# Patient Record
Sex: Female | Born: 1985 | Race: White | Hispanic: No | Marital: Married | Smoking: Never smoker
Health system: Southern US, Community
[De-identification: ages and names within clinical notes are randomized; demographics above are authoritative.]

## PROBLEM LIST (undated history)

## (undated) DIAGNOSIS — F419 Anxiety disorder, unspecified: Secondary | ICD-10-CM

## (undated) DIAGNOSIS — Q513 Bicornate uterus: Secondary | ICD-10-CM

## (undated) DIAGNOSIS — K831 Obstruction of bile duct: Secondary | ICD-10-CM

## (undated) DIAGNOSIS — F32A Depression, unspecified: Secondary | ICD-10-CM

## (undated) DIAGNOSIS — Z9889 Other specified postprocedural states: Secondary | ICD-10-CM

## (undated) DIAGNOSIS — O26619 Liver and biliary tract disorders in pregnancy, unspecified trimester: Secondary | ICD-10-CM

## (undated) DIAGNOSIS — J45909 Unspecified asthma, uncomplicated: Secondary | ICD-10-CM

## (undated) DIAGNOSIS — O021 Missed abortion: Secondary | ICD-10-CM

## (undated) DIAGNOSIS — F329 Major depressive disorder, single episode, unspecified: Secondary | ICD-10-CM

## (undated) DIAGNOSIS — O139 Gestational [pregnancy-induced] hypertension without significant proteinuria, unspecified trimester: Secondary | ICD-10-CM

## (undated) DIAGNOSIS — O26649 Intrahepatic cholestasis of pregnancy, unspecified trimester: Secondary | ICD-10-CM

## (undated) DIAGNOSIS — R112 Nausea with vomiting, unspecified: Secondary | ICD-10-CM

## (undated) HISTORY — DX: Liver and biliary tract disorders in pregnancy, unspecified trimester: O26.619

## (undated) HISTORY — DX: Gestational (pregnancy-induced) hypertension without significant proteinuria, unspecified trimester: O13.9

## (undated) HISTORY — PX: TONSILLECTOMY: SUR1361

## (undated) HISTORY — DX: Depression, unspecified: F32.A

## (undated) HISTORY — DX: Anxiety disorder, unspecified: F41.9

## (undated) HISTORY — PX: WISDOM TOOTH EXTRACTION: SHX21

## (undated) HISTORY — DX: Intrahepatic cholestasis of pregnancy, unspecified trimester: O26.649

## (undated) HISTORY — PX: DILATION AND CURETTAGE OF UTERUS: SHX78

## (undated) HISTORY — DX: Major depressive disorder, single episode, unspecified: F32.9

## (undated) HISTORY — DX: Bicornate uterus: Q51.3

## (undated) HISTORY — DX: Obstruction of bile duct: K83.1

---

## 2008-09-17 ENCOUNTER — Encounter: Admission: RE | Admit: 2008-09-17 | Discharge: 2008-09-17 | Payer: Self-pay | Admitting: Family Medicine

## 2010-02-19 IMAGING — US US RENAL
1 series · 14 of 25 positions shown · non-contrast
Comparison: None

CLINICAL DATA: Uncontrolled blood pressure

RENAL/URINARY TRACT ULTRASOUND COMPLETE

[Series 1: us renal · 0.24mm/px · 14 of 30 slices shown]
[im 1/30]
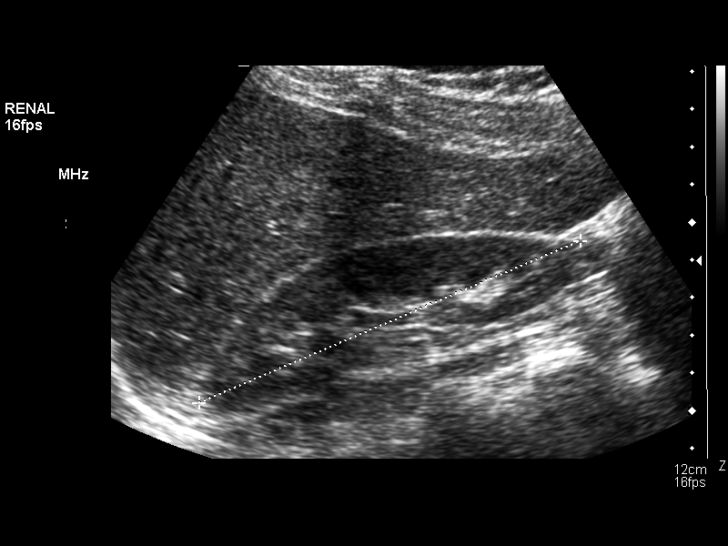
[im 3/30]
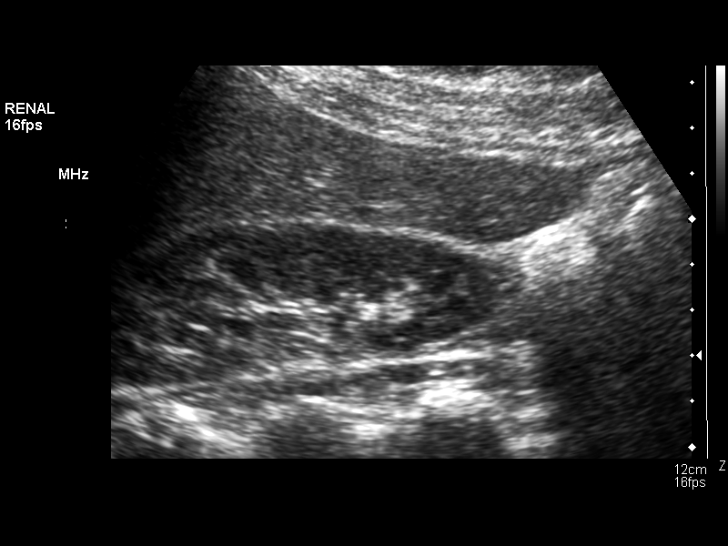
[im 5/30]
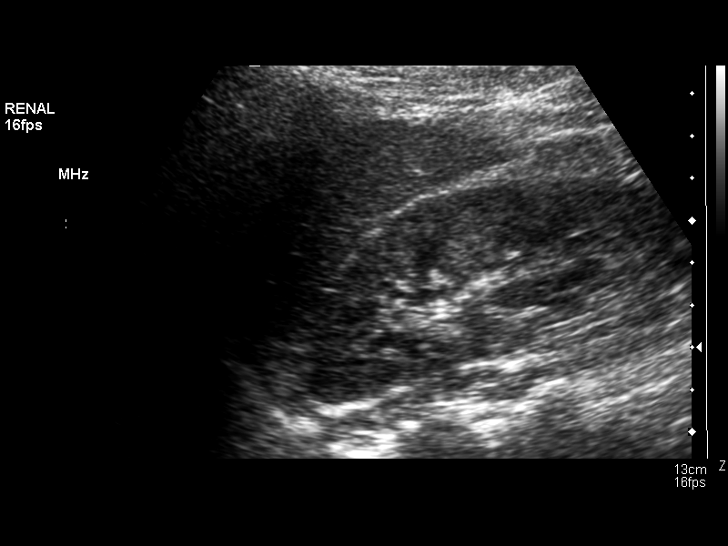
[im 8/30]
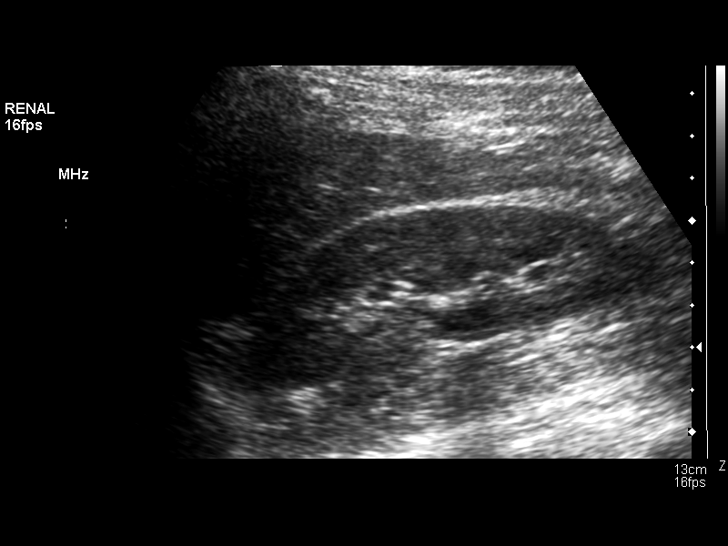
[im 10/30]
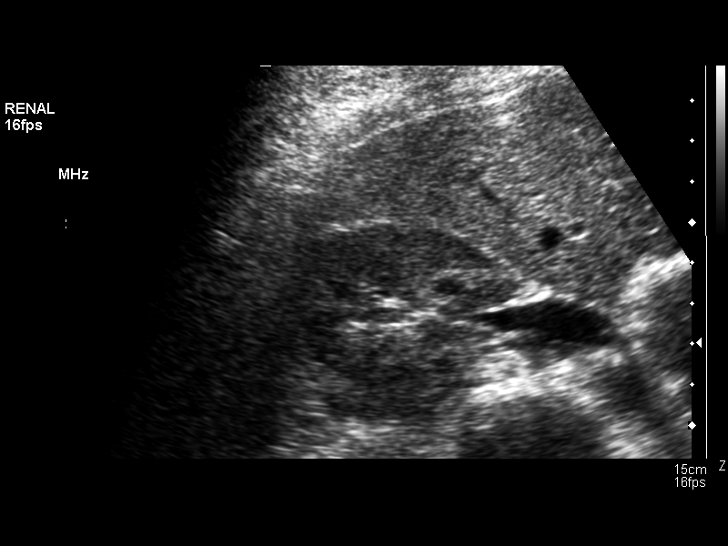
[im 11/30]
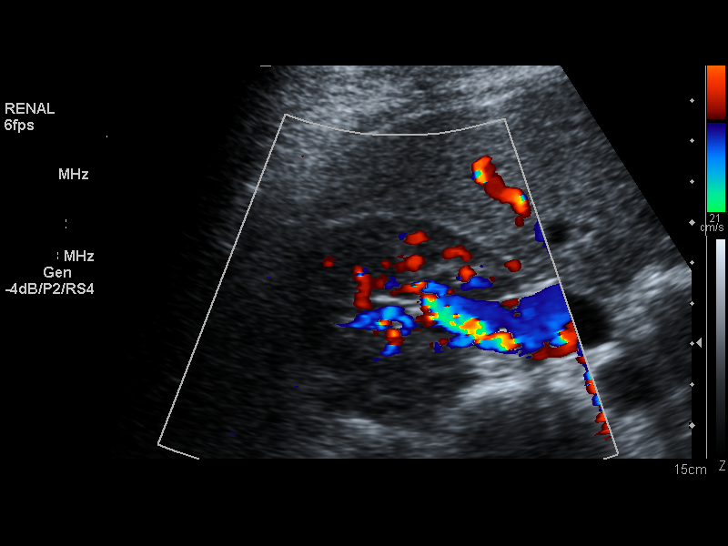
[im 14/30]
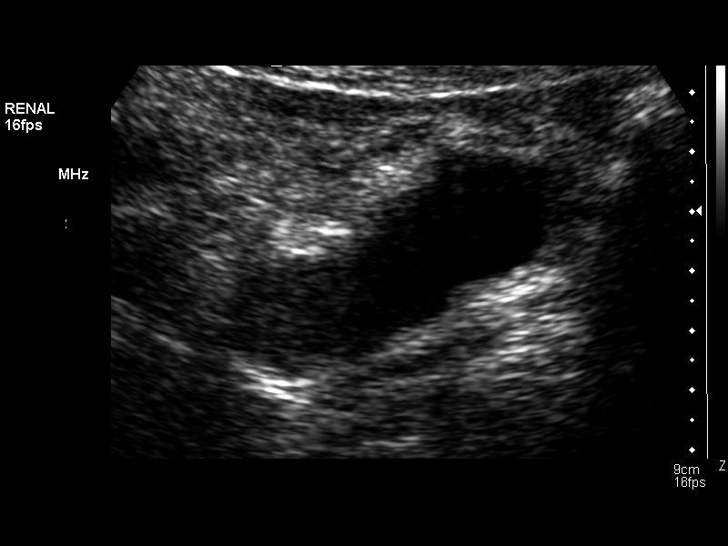
[im 16/30]
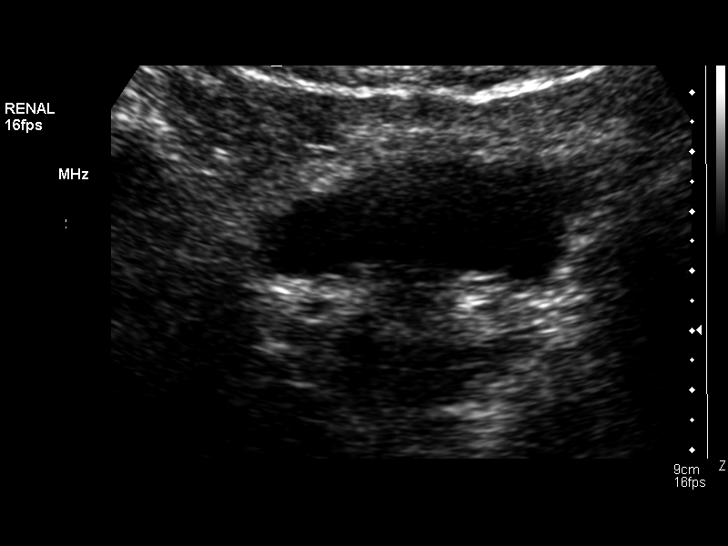
[im 19/30]
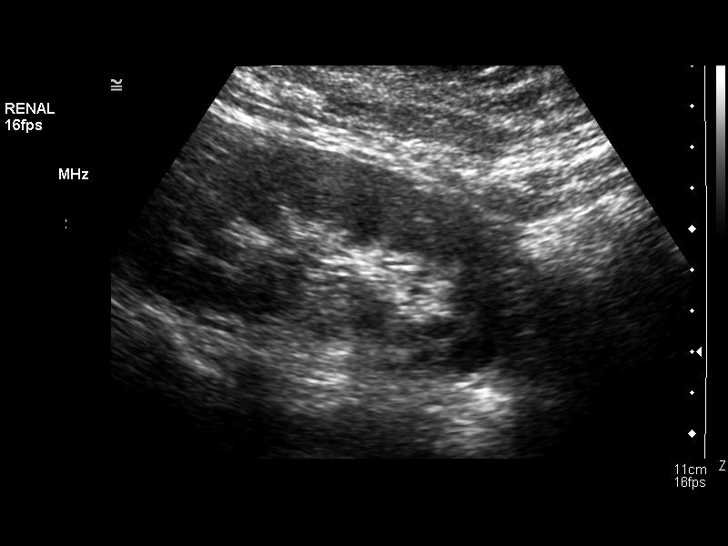
[im 20/30]
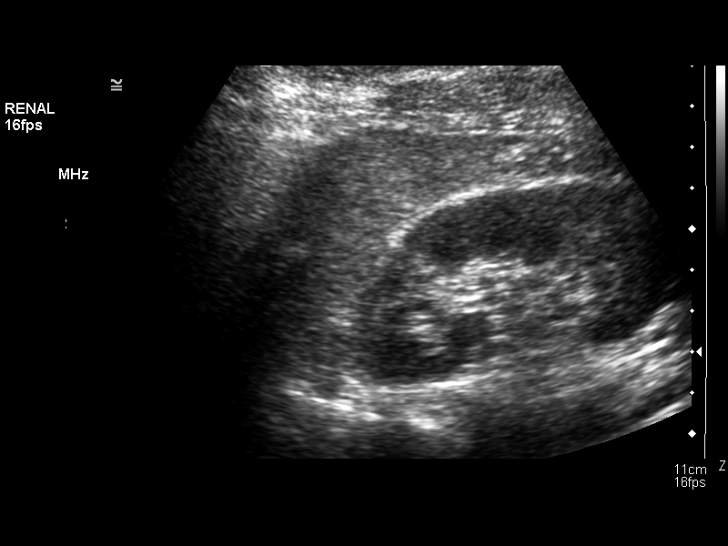
[im 22/30]
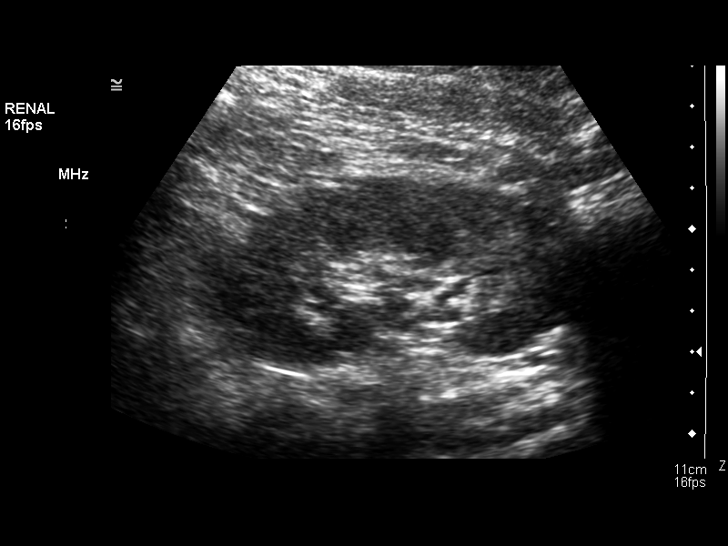
[im 25/30]
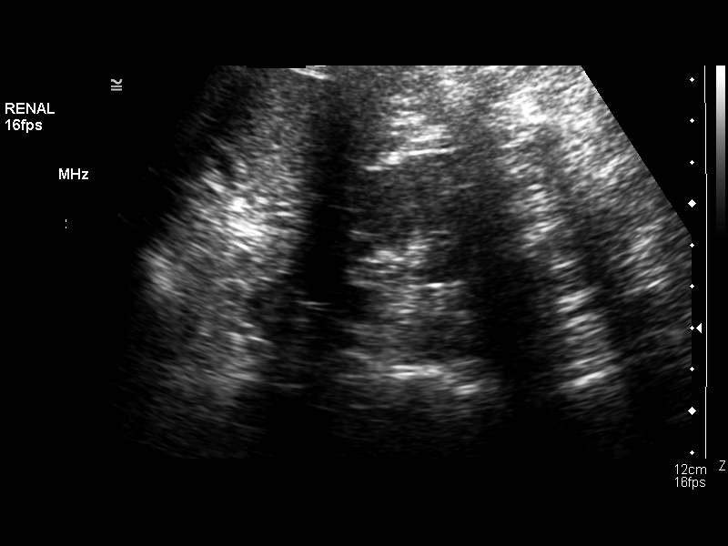
[im 27/30]
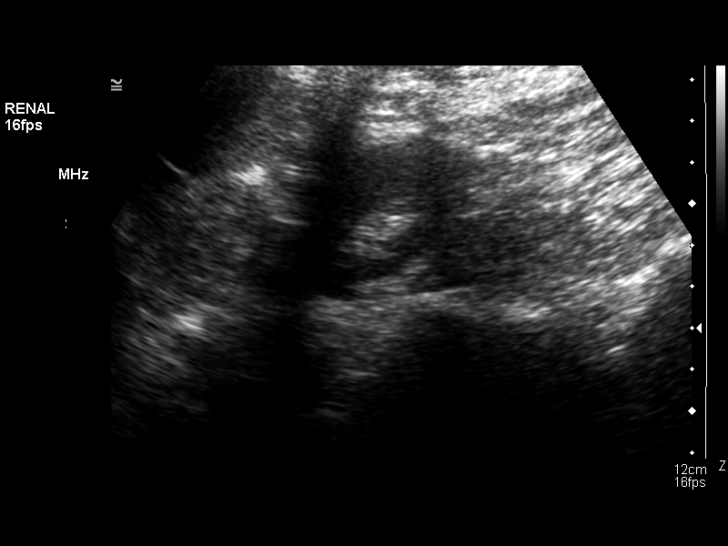
[im 30/30]
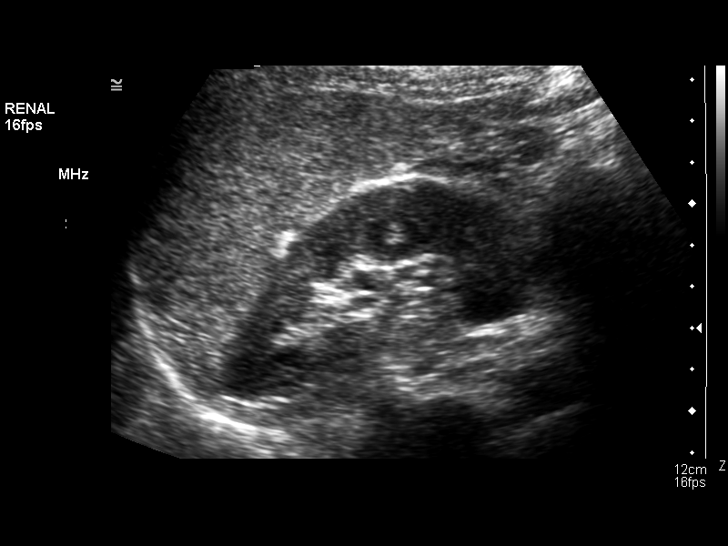

[14 of 25 positions shown; findings below may reference images not displayed]

FINDINGS: Right Kidney:  No hydronephrosis is seen.  The right kidney
measures 11.0 cm sagittally.  Renal echogenicity is within normal
limits.

Left Kidney:  No hydronephrosis and the left kidney measures
cm sagittally.

Bladder:  The urinary bladder is decompressed and cannot be well
assessed.
IMPRESSION: No hydronephrosis.  Renal parenchymal echogenicity is within normal
limits.

## 2014-04-03 ENCOUNTER — Encounter (HOSPITAL_COMMUNITY): Payer: Self-pay | Admitting: Obstetrics and Gynecology

## 2014-04-03 DIAGNOSIS — O021 Missed abortion: Secondary | ICD-10-CM

## 2014-04-03 HISTORY — DX: Missed abortion: O02.1

## 2014-04-03 NOTE — H&P (Signed)
Lorraine Garrett is an 29 y.o. female G1P0 at 7112 wk by dates, 9 wk by recent US, no FHTs.d/w pt and husband miscarriage.  D/w pt management of missed AB.  Pt desires D&C, voiced understanding to r/b/a.    Pertinent Gynecological History: Menses: regular every month without intermenstrual spotting Bleeding: normal Contraception: none, desired pregnancy Blood transfusions: none Sexually transmitted diseases: no past history Previous GYN Procedures: N/A  Last mammogram: N/A  Last pap: normal  OB History: G1P0010  Question bicorate uterus Dyspareunia helped by trigger point injection  Menstrual History: No LMP recorded.    Past Medical History  Diagnosis Date  . 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 Aspirin Other (See Comments)    Asthma attack  Advil OK    Review of Systems  Constitutional: Negative.   HENT: Negative.   Eyes: Negative.   Respiratory: Negative.   Cardiovascular: Negative.   Gastrointestinal: Negative.   Genitourinary: Negative.   Musculoskeletal: Negative.   Skin: Negative.   Neurological: Negative.   Psychiatric/Behavioral: Negative.     There were no vitals taken for this visit. Physical Exam  Constitutional: She is oriented to person, place, and time. She appears well-developed and well-nourished.  HENT:  Head: Normocephalic and atraumatic.  Cardiovascular: Normal rate and regular rhythm.   Respiratory: Effort normal and breath sounds normal. No respiratory distress. She has no wheezes.  GI: Soft. Bowel sounds are normal. She exhibits no distension. There is no tenderness.  Musculoskeletal: Normal range of motion.  Neurological: She is alert and oriented to person, place, and time.  Skin: Skin is warm and dry.   Psychiatric: She has a normal mood and affect. Her behavior is normal.   A+/Ab SCr neg/Hgb 12.7/Plt 324K/RPRNR/Ur Cx neg/HepBsAg neg/HIV neg/ GC neg/ Chl neg/ RI/CF neg Dated by US cw LMP  Assessment/Plan: G1P0 with MAB for D&C, d/w pt r/b/a of surgery, wishes to proceed  Bovard-Stuckert, Taequan Stockhausen 04/03/2014, 10:24 PM

## 2014-04-04 ENCOUNTER — Encounter (HOSPITAL_COMMUNITY)
Admission: RE | Disposition: A | Discharge status: Home / Self Care | Payer: Self-pay | Source: Ambulatory Visit | Attending: Obstetrics and Gynecology

## 2014-04-04 ENCOUNTER — Encounter (HOSPITAL_COMMUNITY): Payer: Self-pay

## 2014-04-04 ENCOUNTER — Ambulatory Visit (HOSPITAL_COMMUNITY)
Admission: RE | Admit: 2014-04-04 | Discharge: 2014-04-04 | Disposition: A | Discharge status: Home / Self Care | Payer: BC Managed Care – PPO | Source: Ambulatory Visit | Attending: Obstetrics and Gynecology | Admitting: Obstetrics and Gynecology

## 2014-04-04 ENCOUNTER — Ambulatory Visit (HOSPITAL_COMMUNITY): Payer: BC Managed Care – PPO | Admitting: Anesthesiology

## 2014-04-04 DIAGNOSIS — F419 Anxiety disorder, unspecified: Secondary | ICD-10-CM | POA: Insufficient documentation

## 2014-04-04 DIAGNOSIS — Z888 Allergy status to other drugs, medicaments and biological substances status: Secondary | ICD-10-CM | POA: Insufficient documentation

## 2014-04-04 DIAGNOSIS — Z9889 Other specified postprocedural states: Secondary | ICD-10-CM

## 2014-04-04 DIAGNOSIS — O021 Missed abortion: Secondary | ICD-10-CM | POA: Diagnosis present

## 2014-04-04 DIAGNOSIS — I10 Essential (primary) hypertension: Secondary | ICD-10-CM | POA: Diagnosis not present

## 2014-04-04 DIAGNOSIS — J45909 Unspecified asthma, uncomplicated: Secondary | ICD-10-CM | POA: Diagnosis not present

## 2014-04-04 HISTORY — DX: Nausea with vomiting, unspecified: R11.2

## 2014-04-04 HISTORY — DX: Missed abortion: O02.1

## 2014-04-04 HISTORY — PX: DILATION AND EVACUATION: SHX1459

## 2014-04-04 HISTORY — DX: Other specified postprocedural states: Z98.890

## 2014-04-04 LAB — CBC
HCT: 38.7 % (ref 36.0–46.0)
Hemoglobin: 13.6 g/dL (ref 12.0–15.0)
MCH: 29.6 pg (ref 26.0–34.0)
MCHC: 35.1 g/dL (ref 30.0–36.0)
MCV: 84.3 fL (ref 78.0–100.0)
PLATELETS: 273 10*3/uL (ref 150–400)
RBC: 4.59 MIL/uL (ref 3.87–5.11)
RDW: 12.8 % (ref 11.5–15.5)
WBC: 6.4 10*3/uL (ref 4.0–10.5)

## 2014-04-04 LAB — ABO/RH: ABO/RH(D): A POS

## 2014-04-04 SURGERY — DILATION AND EVACUATION, UTERUS
Anesthesia: Monitor Anesthesia Care | Site: Vagina

## 2014-04-04 MED ORDER — DOXYCYCLINE HYCLATE 100 MG PO TABS
ORAL_TABLET | ORAL | Status: AC
  Filled 2014-04-04: qty 1

## 2014-04-04 MED ORDER — LIDOCAINE HCL (CARDIAC) 20 MG/ML IV SOLN
INTRAVENOUS | Status: AC
  Filled 2014-04-04: qty 5

## 2014-04-04 MED ORDER — ONDANSETRON HCL 4 MG/2ML IJ SOLN
INTRAMUSCULAR | Status: DC | PRN
  Administered 2014-04-04: 4 mg via INTRAVENOUS

## 2014-04-04 MED ORDER — ONDANSETRON HCL 4 MG/2ML IJ SOLN
INTRAMUSCULAR | Status: AC
  Filled 2014-04-04: qty 2

## 2014-04-04 MED ORDER — PRENATAL MULTIVITAMIN CH: 1.0000 | ORAL_TABLET | Freq: Every day | ORAL | Status: DC

## 2014-04-04 MED ORDER — SCOPOLAMINE 1 MG/3DAYS TD PT72
MEDICATED_PATCH | TRANSDERMAL | Status: AC
  Filled 2014-04-04: qty 1

## 2014-04-04 MED ORDER — LIDOCAINE HCL 2 % IJ SOLN
INTRAMUSCULAR | Status: AC
  Filled 2014-04-04: qty 20

## 2014-04-04 MED ORDER — KETOROLAC TROMETHAMINE 30 MG/ML IJ SOLN
INTRAMUSCULAR | Status: AC
  Filled 2014-04-04: qty 1

## 2014-04-04 MED ORDER — MIDAZOLAM HCL 5 MG/5ML IJ SOLN
INTRAMUSCULAR | Status: DC | PRN
  Administered 2014-04-04: 2 mg via INTRAVENOUS

## 2014-04-04 MED ORDER — DOXYCYCLINE HYCLATE 100 MG PO TABS: 200.0000 mg | ORAL_TABLET | Freq: Once | ORAL | Status: DC

## 2014-04-04 MED ORDER — SCOPOLAMINE 1 MG/3DAYS TD PT72
1.0000 | MEDICATED_PATCH | Freq: Once | TRANSDERMAL | Status: DC
  Administered 2014-04-04: 1.5 mg via TRANSDERMAL

## 2014-04-04 MED ORDER — LIDOCAINE HCL (CARDIAC) 20 MG/ML IV SOLN
INTRAVENOUS | Status: DC | PRN
Start: 2014-04-04 — End: 2014-04-04
  Administered 2014-04-04: 40 mg via INTRAVENOUS

## 2014-04-04 MED ORDER — LACTATED RINGERS IV SOLN: INTRAVENOUS | Status: DC

## 2014-04-04 MED ORDER — FENTANYL CITRATE 0.05 MG/ML IJ SOLN
INTRAMUSCULAR | Status: AC
  Filled 2014-04-04: qty 2

## 2014-04-04 MED ORDER — LIDOCAINE HCL 2 % IJ SOLN
INTRAMUSCULAR | Status: DC | PRN
  Administered 2014-04-04: 15 mL

## 2014-04-04 MED ORDER — DOXYCYCLINE HYCLATE 100 MG PO TABS
100.0000 mg | ORAL_TABLET | Freq: Once | ORAL | Status: AC
  Administered 2014-04-04: 100 mg via ORAL

## 2014-04-04 MED ORDER — PROPOFOL 10 MG/ML IV BOLUS
INTRAVENOUS | Status: DC | PRN
  Administered 2014-04-04: 20 mg via INTRAVENOUS
  Administered 2014-04-04 (×2): 30 mg via INTRAVENOUS
  Administered 2014-04-04 (×3): 20 mg via INTRAVENOUS
  Administered 2014-04-04 (×2): 30 mg via INTRAVENOUS

## 2014-04-04 MED ORDER — IBUPROFEN 800 MG PO TABS: 800.0000 mg | ORAL_TABLET | Freq: Three times a day (TID) | ORAL | Status: DC | PRN

## 2014-04-04 MED ORDER — PROPOFOL 10 MG/ML IV BOLUS
INTRAVENOUS | Status: AC
  Filled 2014-04-04: qty 20

## 2014-04-04 MED ORDER — DEXAMETHASONE SODIUM PHOSPHATE 4 MG/ML IJ SOLN
INTRAMUSCULAR | Status: AC
  Filled 2014-04-04: qty 1

## 2014-04-04 MED ORDER — KETOROLAC TROMETHAMINE 30 MG/ML IJ SOLN
INTRAMUSCULAR | Status: DC | PRN
  Administered 2014-04-04: 30 mg via INTRAVENOUS

## 2014-04-04 MED ORDER — DEXAMETHASONE SODIUM PHOSPHATE 4 MG/ML IJ SOLN
INTRAMUSCULAR | Status: DC | PRN
  Administered 2014-04-04: 4 mg via INTRAVENOUS

## 2014-04-04 MED ORDER — MIDAZOLAM HCL 2 MG/2ML IJ SOLN
INTRAMUSCULAR | Status: AC
  Filled 2014-04-04: qty 2

## 2014-04-04 MED ORDER — OXYCODONE-ACETAMINOPHEN 5-325 MG PO TABS: 1.0000 | ORAL_TABLET | Freq: Four times a day (QID) | ORAL | Status: DC | PRN

## 2014-04-04 MED ORDER — LACTATED RINGERS IV SOLN
INTRAVENOUS | Status: DC
  Administered 2014-04-04: 10:00:00 via INTRAVENOUS

## 2014-04-04 MED ORDER — BUPIVACAINE IN DEXTROSE 0.75-8.25 % IT SOLN
INTRATHECAL | Status: DC | PRN
  Administered 2014-04-04: 1.2 mL via INTRATHECAL

## 2014-04-04 MED ORDER — FENTANYL CITRATE 0.05 MG/ML IJ SOLN
INTRAMUSCULAR | Status: DC | PRN
  Administered 2014-04-04: 100 ug via INTRAVENOUS

## 2014-04-04 SURGICAL SUPPLY — 17 items
CATH ROBINSON RED A/P 16FR (CATHETERS) ×3 IMPLANT
CLOTH BEACON ORANGE TIMEOUT ST (SAFETY) ×3 IMPLANT
DECANTER SPIKE VIAL GLASS SM (MISCELLANEOUS) ×3 IMPLANT
GLOVE BIO SURGEON STRL SZ 6.5 (GLOVE) ×2 IMPLANT
GLOVE BIO SURGEONS STRL SZ 6.5 (GLOVE) ×1
GOWN STRL REUS W/TWL LRG LVL3 (GOWN DISPOSABLE) ×6 IMPLANT
KIT BERKELEY 1ST TRIMESTER 3/8 (MISCELLANEOUS) ×3 IMPLANT
NS IRRIG 1000ML POUR BTL (IV SOLUTION) ×3 IMPLANT
PACK VAGINAL MINOR WOMEN LF (CUSTOM PROCEDURE TRAY) ×3 IMPLANT
PAD OB MATERNITY 4.3X12.25 (PERSONAL CARE ITEMS) ×3 IMPLANT
PAD PREP 24X48 CUFFED NSTRL (MISCELLANEOUS) ×3 IMPLANT
SET BERKELEY SUCTION TUBING (SUCTIONS) ×3 IMPLANT
TOWEL OR 17X24 6PK STRL BLUE (TOWEL DISPOSABLE) ×6 IMPLANT
VACURETTE 10 RIGID CVD (CANNULA) ×3 IMPLANT
VACURETTE 7MM CVD STRL WRAP (CANNULA) IMPLANT
VACURETTE 8 RIGID CVD (CANNULA) IMPLANT
VACURETTE 9 RIGID CVD (CANNULA) IMPLANT

## 2014-04-04 NOTE — Interval H&P Note (Signed)
History and Physical Interval Note:  04/04/2014 10:14 AM  Lorraine Garrett  has presented today for surgery, with the diagnosis of Missed AB  The various methods of treatment have been discussed with the patient and family. After consideration of risks, benefits and other options for treatment, the patient has consented to  Procedure(s): DILATATION AND EVACUATION (N/A) as a surgical intervention .  The patient's history has been reviewed, patient examined, no change in status, stable for surgery.  I have reviewed the patient's chart and labs.  Questions were answered to the patient's satisfaction.     Bovard-Stuckert, Anniebelle Devore

## 2014-04-04 NOTE — Anesthesia Postprocedure Evaluation (Signed)
  Anesthesia Post-op Note  Patient: Lorraine Garrett  Procedure(s) Performed: Procedure(s): DILATATION AND EVACUATION (N/A)  Patient Location: PACU  Anesthesia Type:MAC  Level of Consciousness: awake, alert  and oriented  Airway and Oxygen Therapy: Patient Spontanous Breathing  Post-op Pain: mild  Post-op Assessment: Post-op Vital signs reviewed, Patient's Cardiovascular Status Stable, Respiratory Function Stable, Patent Airway, No signs of Nausea or vomiting and Pain level controlled  Post-op Vital Signs: Reviewed and stable  Last Vitals:  Filed Vitals:   04/04/14 1115  BP: 124/66  Pulse: 77  Temp:   Resp: 14    Complications: No apparent anesthesia complications

## 2014-04-04 NOTE — Brief Op Note (Signed)
04/04/2014  10:50 AM  PATIENT:  Kaylyn Limaryn Lightcap  29 y.o. female  PRE-OPERATIVE DIAGNOSIS:  Missed AB  POST-OPERATIVE DIAGNOSIS:  Missed abortion  PROCEDURE:  Procedure(s): DILATATION AND EVACUATION (N/A)  SURGEON:  Surgeon(s) and Role:    * Aliyana Dlugosz Bovard-Stuckert, MD - Primary  ANESTHESIA:   local and IV sedation/MAC  EBL:  Total I/O In: 500 [I.V.:500] Out: 250 [Urine:150; Blood:100]  BLOOD ADMINISTERED:none  DRAINS: none   LOCAL MEDICATIONS USED:  LIDOCAINE  and Amount: 15 ml  SPECIMEN:  Source of Specimen:  POC  DISPOSITION OF SPECIMEN:  PATHOLOGY  COUNTS:  YES  TOURNIQUET:  * No tourniquets in log *  DICTATION: .Other Dictation: Dictation Number [id number]  PLAN OF CARE: Discharge to home after PACU  PATIENT DISPOSITION:  PACU - hemodynamically stable.   Delay start of Pharmacological VTE agent (>24hrs) due to surgical blood loss or risk of bleeding: not applicable

## 2014-04-04 NOTE — Anesthesia Procedure Notes (Signed)
Spinal Patient location during procedure: OR Start time: 04/04/2014 10:32 AM Staffing Anesthesiologist: Briunna Leicht A. Performed by: anesthesiologist  Preanesthetic Checklist Completed: patient identified, site marked, surgical consent, pre-op evaluation, timeout performed, IV checked, risks and benefits discussed and monitors and equipment checked Spinal Block Patient position: sitting Prep: DuraPrep Patient monitoring: heart rate, cardiac monitor, continuous pulse ox and blood pressure Approach: midline Location: L4-5 Injection technique: single-shot Needle Needle type: Sprotte  Needle gauge: 24 G Needle length: 9 cm Needle insertion depth: 5 cm Assessment Sensory level: T6 Additional Notes Patient tolerated procedure well. Adequate sensory level.

## 2014-04-04 NOTE — Discharge Instructions (Signed)
DISCHARGE INSTRUCTIONS:  D&E The following instructions have been prepared to help you care for yourself upon your return home.  MAY TAKE IBUPROFEN (MOTRIN, ADVIL) OR ALEVE AFTER 4:30 PM FOR CRAMPS! MAY TAKE OFF THE PATCH BEHIND YOUR EAR IN 48 HOURS!!    Personal hygiene:  Use sanitary pads for vaginal drainage, not tampons.  Shower the day after your procedure.  NO tub baths, pools or Jacuzzis for 2-3 weeks.  Wipe front to back after using the bathroom.  Activity and limitations:  Do NOT drive or operate any equipment for 24 hours. The effects of anesthesia are still present and drowsiness may result.  Do NOT rest in bed all day.  Walking is encouraged.  Walk up and down stairs slowly.  You may resume your normal activity in one to two days or as indicated by your physician.  Sexual activity: NO intercourse for at least 2 weeks after the procedure, or as indicated by your physician.  Diet: Eat a light meal as desired this evening. You may resume your usual diet tomorrow.  Return to work: You may resume your work activities in one to two days or as indicated by your doctor.  What to expect after your surgery: Expect to have vaginal bleeding/discharge for 2-3 days and spotting for up to 10 days. It is not unusual to have soreness for up to 1-2 weeks. You may have a slight burning sensation when you urinate for the first day. Mild cramps may continue for a couple of days. You may have a regular period in 2-6 weeks.  Call your doctor for any of the following:  Excessive vaginal bleeding, saturating and changing one pad every hour.  Inability to urinate 6 hours after discharge from hospital.  Pain not relieved by pain medication.  Fever of 100.4 F or greater.  Unusual vaginal discharge or odor.   Call for an appointment:    Patients signature: ______________________  Nurses signature ________________________  Support person's  signature_______________________

## 2014-04-04 NOTE — Anesthesia Preprocedure Evaluation (Signed)
Anesthesia Evaluation  Patient identified by MRN, date of birth, ID band Patient awake    Reviewed: Allergy & Precautions, NPO status , Patient's Chart, lab work & pertinent test results  History of Anesthesia Complications (+) PONV and history of anesthetic complications  Airway Mallampati: II       Dental no notable dental hx. (+) Teeth Intact   Pulmonary asthma ,  breath sounds clear to auscultation  Pulmonary exam normal       Cardiovascular negative cardio ROS  Rhythm:Regular Rate:Normal     Neuro/Psych negative neurological ROS  negative psych ROS   GI/Hepatic negative GI ROS, Neg liver ROS,   Endo/Other  negative endocrine ROS  Renal/GU negative Renal ROS  negative genitourinary   Musculoskeletal negative musculoskeletal ROS (+)   Abdominal   Peds  Hematology negative hematology ROS (+)   Anesthesia Other Findings   Reproductive/Obstetrics (+) Pregnancy Missed Ab                             Anesthesia Physical Anesthesia Plan  ASA: II  Anesthesia Plan:    Post-op Pain Management:    Induction:   Airway Management Planned: Natural Airway  Additional Equipment:   Intra-op Plan:   Post-operative Plan:   Informed Consent: I have reviewed the patients History and Physical, chart, labs and discussed the procedure including the risks, benefits and alternatives for the proposed anesthesia with the patient or authorized representative who has indicated his/her understanding and acceptance.     Plan Discussed with: Anesthesiologist, CRNA and Surgeon  Anesthesia Plan Comments:         Anesthesia Quick Evaluation

## 2014-04-04 NOTE — Transfer of Care (Signed)
Immediate Anesthesia Transfer of Care Note  Patient: Lorraine Garrett  Procedure(s) Performed: Procedure(s): DILATATION AND EVACUATION (N/A)  Patient Location: PACU  Anesthesia Type:MAC  Level of Consciousness: awake, alert  and oriented  Airway & Oxygen Therapy: Patient Spontanous Breathing and Patient connected to nasal cannula oxygen  Post-op Assessment: Report given to RN  Post vital signs: Reviewed  Last Vitals:  Filed Vitals:   04/04/14 0942  BP: 131/92  Pulse: 92  Temp: 36.7 C  Resp: 16    Complications: No apparent anesthesia complications

## 2014-04-05 NOTE — Op Note (Signed)
NAMBurnetta Sabin:  Ruest, Taraann               ACCOUNT NO.:  [id number]  MEDICAL RECORD NO.:  [id number]  LOCATION:  [facility]                          FACILITY:  [facility]  PHYSICIAN:  Sherron MondayJody Bovard, MD        DATE OF BIRTH:  December 12, 1985  DATE OF PROCEDURE:  04/04/2014 DATE OF DISCHARGE:  04/04/2014                              OPERATIVE REPORT   PREOPERATIVE DIAGNOSIS:  Missed abortion.  POSTOPERATIVE DIAGNOSIS:  Missed abortion.  PROCEDURE:  Suction D and E.  SURGEON:  Sherron MondayJody Bovard, MD  ANESTHESIA:  IV sedation MAC with local paracervical block.  IV FLUIDS:  500 mL.  URINE OUTPUT:  150 mL.  EBL:  100 mL.  COMPLICATIONS:  None.  PATHOLOGY:  Products of conception.  DESCRIPTION OF PROCEDURE:  After informed consent was reviewed with both the patient and her husband, she was transported to the OR, placed on the table in supine position.  Once anesthesia had been induced and found to be adequate, she was then placed in the [place] stirrups. Prepped and draped in the normal sterile fashion.  Her bladder was sterilely drained.  Using an open-sided speculum, her cervix was easily visualized.  The paracervical block was placed with 15 mL of 2% lidocaine.  A single-tooth tenaculum was used to grasp the anterior lip of her cervix.  The cervix was dilated to accommodate a 9-curette to 1827- [place] without difficulty.  Several passes were made of suctioning curette yielding what was thought to be an appropriate amount of tissue. The tenaculum was removed from the cervix and the pressure was held to make this hemostatic.  The speculum was then removed from the vagina.  Sponge, lap, and needle counts were correct x2 per the operating team.  The patient was turned to the supine position.  She tolerated the procedure well.     Sherron MondayJody Bovard, MD     JB/MEDQ  D:  04/04/2014  T:  04/05/2014  Job:  [id number]

## 2014-04-07 ENCOUNTER — Encounter (HOSPITAL_COMMUNITY): Payer: Self-pay | Admitting: Obstetrics and Gynecology

## 2014-04-08 ENCOUNTER — Encounter (HOSPITAL_COMMUNITY): Payer: Self-pay | Admitting: General Practice

## 2014-04-08 ENCOUNTER — Inpatient Hospital Stay (HOSPITAL_COMMUNITY): Payer: BC Managed Care – PPO

## 2014-04-08 ENCOUNTER — Inpatient Hospital Stay (HOSPITAL_COMMUNITY)
Admission: AD | Admit: 2014-04-08 | Discharge: 2014-04-08 | Disposition: A | Discharge status: Home / Self Care | Payer: BC Managed Care – PPO | Source: Ambulatory Visit | Attending: Obstetrics and Gynecology | Admitting: Obstetrics and Gynecology

## 2014-04-08 DIAGNOSIS — R109 Unspecified abdominal pain: Secondary | ICD-10-CM | POA: Diagnosis not present

## 2014-04-08 DIAGNOSIS — O081 Delayed or excessive hemorrhage following ectopic and molar pregnancy: Secondary | ICD-10-CM | POA: Diagnosis not present

## 2014-04-08 DIAGNOSIS — O021 Missed abortion: Secondary | ICD-10-CM | POA: Insufficient documentation

## 2014-04-08 DIAGNOSIS — N939 Abnormal uterine and vaginal bleeding, unspecified: Secondary | ICD-10-CM | POA: Diagnosis present

## 2014-04-08 DIAGNOSIS — Z9889 Other specified postprocedural states: Secondary | ICD-10-CM

## 2014-04-08 HISTORY — DX: Unspecified asthma, uncomplicated: J45.909

## 2014-04-08 LAB — CBC WITH DIFFERENTIAL/PLATELET
BASOS ABS: 0 10*3/uL (ref 0.0–0.1)
Basophils Relative: 1 % (ref 0–1)
EOS PCT: 7 % — AB (ref 0–5)
Eosinophils Absolute: 0.4 10*3/uL (ref 0.0–0.7)
HCT: 32.3 % — ABNORMAL LOW (ref 36.0–46.0)
Hemoglobin: 11.3 g/dL — ABNORMAL LOW (ref 12.0–15.0)
Lymphocytes Relative: 42 % (ref 12–46)
Lymphs Abs: 2.6 10*3/uL (ref 0.7–4.0)
MCH: 29.9 pg (ref 26.0–34.0)
MCHC: 35 g/dL (ref 30.0–36.0)
MCV: 85.4 fL (ref 78.0–100.0)
Monocytes Absolute: 0.8 10*3/uL (ref 0.1–1.0)
Monocytes Relative: 13 % — ABNORMAL HIGH (ref 3–12)
NEUTROS ABS: 2.4 10*3/uL (ref 1.7–7.7)
Neutrophils Relative %: 39 % — ABNORMAL LOW (ref 43–77)
PLATELETS: 230 10*3/uL (ref 150–400)
RBC: 3.78 MIL/uL — AB (ref 3.87–5.11)
RDW: 12.7 % (ref 11.5–15.5)
WBC: 6.2 10*3/uL (ref 4.0–10.5)

## 2014-04-08 NOTE — Discharge Instructions (Signed)
Dilation and Curettage or Vacuum Curettage, Care After  Refer to this sheet in the next few weeks. These instructions provide you with information on caring for yourself after your procedure. Your health care provider may also give you more specific instructions. Your treatment has been planned according to current medical practices, but problems sometimes occur. Call your health care provider if you have any problems or questions after your procedure.  WHAT TO EXPECT AFTER THE PROCEDURE  After your procedure, it is typical to have light cramping and bleeding. This may last for 2 days to 2 weeks after the procedure.  HOME CARE INSTRUCTIONS   · Do not drive for 24 hours.  · Wait 1 week before returning to strenuous activities.  · Take your temperature 2 times a day for 4 days and write it down. Provide these temperatures to your health care provider if you develop a fever.  · Avoid long periods of standing.  · Avoid heavy lifting, pushing, or pulling. Do not lift anything heavier than 10 pounds (4.5 kg).  · Limit stair climbing to once or twice a day.  · Take rest periods often.  · You may resume your usual diet.  · Drink enough fluids to keep your urine clear or pale yellow.  · Your usual bowel function should return. If you have constipation, you may:  ¨ Take a mild laxative with permission from your health care provider.  ¨ Add fruit and bran to your diet.  ¨ Drink more fluids.  · Take showers instead of baths until your health care provider gives you permission to take baths.  · Do not go swimming or use a hot tub until your health care provider approves.  · Try to have someone with you or available to you the first 24-48 hours, especially if you were given a general anesthetic.  · Do not douche, use tampons, or have sex (intercourse) for 2 weeks after the procedure.  · Only take over-the-counter or prescription medicines as directed by your health care provider. Do not take aspirin. It can cause  bleeding.  · Follow up with your health care provider as directed.  SEEK MEDICAL CARE IF:   · You have increasing cramps or pain that is not relieved with medicine.  · You have abdominal pain that does not seem to be related to the same area of earlier cramping and pain.  · You have bad smelling vaginal discharge.  · You have a rash.  · You are having problems with any medicine.  SEEK IMMEDIATE MEDICAL CARE IF:   · You have bleeding that is heavier than a normal menstrual period.  · You have a fever.  · You have chest pain.  · You have shortness of breath.  · You feel dizzy or feel like fainting.  · You pass out.  · You have pain in your shoulder strap area.  · You have heavy vaginal bleeding with or without blood clots.  MAKE SURE YOU:   · Understand these instructions.  · Will watch your condition.  · Will get help right away if you are not doing well or get worse.  Document Released: 01/29/2000 Document Revised: 02/05/2013 Document Reviewed: 08/30/2012  ExitCare® Patient Information ©2015 [employer]. This information is not intended to replace advice given to you by your health care provider. Make sure you discuss any questions you have with your health care provider.

## 2014-04-08 NOTE — MAU Provider Note (Signed)
History     CSN: [id number]  Arrival date and time: 04/08/14 2013   First Provider Initiated Contact with Patient 04/08/14 2036      No chief complaint on file.  HPI  Ms. Lorraine Garrett is a 29 y.o. G1P0010 who presents to MAU today with complaint of heavy vaginal bleeding since this afternoon. The patient had D&E on 04/04/14 for missed AB at ~ [redacted] weeks gestation. She states bleeding has increased since surgery and was similar to a period until this afternoon when she had very heavy bleeding with large clots. She also endorses intermittent abdominal pain since onset of heavier bleeding. She took percocet prior to arrival and feels that it is helping. She denies fever.   OB History    Gravida Para Term Preterm AB TAB SAB Ectopic Multiple Living   1 0   1  1         Past Medical History  Diagnosis Date  . Missed abortion 04/03/2014  . PONV (postoperative nausea and vomiting)   . S/P dilatation and curettage 04/04/2014  . Asthma     Past Surgical History  Procedure Laterality Date  . Tonsillectomy    . Wisdom tooth extraction Bilateral 29yo  . Dilation and evacuation N/A 04/04/2014    Procedure: DILATATION AND EVACUATION;  Surgeon: Sherian Rein, MD;  Location: [facility];  Service: Gynecology;  Laterality: N/A;    History reviewed. No pertinent family history.  History  Substance Use Topics  . Smoking status: Never Smoker   . Smokeless tobacco: Not on file  . Alcohol Use: Not on file    Allergies:  Allergies  Allergen Reactions  . Aspirin Other (See Comments)    Asthma attack    No prescriptions prior to admission    Review of Systems  Constitutional: Negative for fever and malaise/fatigue.  Gastrointestinal: Positive for abdominal pain. Negative for nausea and vomiting.  Genitourinary:       + vaginal bleeding  Neurological: Negative for dizziness and loss of consciousness.   Physical Exam   Blood pressure 121/56, pulse 84, temperature 98.6 F (37 C),  temperature source Oral, resp. rate 16, height  (1.6 m), weight 143 lb (64.864 kg), last menstrual period 01/08/2014, SpO2 100 %, unknown if currently breastfeeding.  Physical Exam  Constitutional: She is oriented to person, place, and time. She appears well-developed and well-nourished. No distress.  HENT:  Head: Normocephalic.  Cardiovascular: Normal rate.   Respiratory: Effort normal.  GI: Soft. She exhibits no distension and no mass. There is tenderness (mild tenderness to palpation of the suprapubic region, slightly right of midline). There is no rebound and no guarding.  Genitourinary: Cervix exhibits no motion tenderness, no discharge and no friability. There is bleeding (small amount of dark blood in the vaginal vault) in the vagina. No vaginal discharge found.  Cervix visually closed  Neurological: She is alert and oriented to person, place, and time.  Skin: Skin is warm and dry. No erythema.  Psychiatric: She has a normal mood and affect.   Results for orders placed or performed during the hospital encounter of 04/08/14 (from the past 24 hour(s))  CBC with Differential/Platelet     Status: Abnormal   Collection Time: 04/08/14  8:50 PM  Result Value Ref Range   WBC 6.2 4.0 - 10.5 K/uL   RBC 3.78 (L) 3.87 - 5.11 MIL/uL   Hemoglobin 11.3 (L) 12.0 - 15.0 g/dL   HCT 40.9 (L) 81.1 - 91.4 %  MCV 85.4 78.0 - 100.0 fL   MCH 29.9 26.0 - 34.0 pg   MCHC 35.0 30.0 - 36.0 g/dL   RDW 16.112.7 09.611.5 - 04.515.5 %   Platelets 230 150 - 400 K/uL   Neutrophils Relative % 39 (L) 43 - 77 %   Neutro Abs 2.4 1.7 - 7.7 K/uL   Lymphocytes Relative 42 12 - 46 %   Lymphs Abs 2.6 0.7 - 4.0 K/uL   Monocytes Relative 13 (H) 3 - 12 %   Monocytes Absolute 0.8 0.1 - 1.0 K/uL   Eosinophils Relative 7 (H) 0 - 5 %   Eosinophils Absolute 0.4 0.0 - 0.7 K/uL   Basophils Relative 1 0 - 1 %   Basophils Absolute 0.0 0.0 - 0.1 K/uL   [place] Ob Comp Less 14 Wks  04/08/2014   CLINICAL DATA:  Missed abortion, post  dilatation and evacuation 4 days prior. Pelvic pain and vaginal bleeding, increase vaginal bleeding for 2 days. Evaluate for retained products of conception.  EXAM: OBSTETRIC <14 WK US AND TRANSVAGINAL OB US  TECHNIQUE: Both transabdominal and transvaginal ultrasound examinations were performed for complete evaluation of the gestation as well as the maternal uterus, adnexal regions, and pelvic cul-de-sac. Transvaginal technique was performed to assess early pregnancy.  COMPARISON:  None.  FINDINGS: There is no intrauterine gestational sac, fetal pole or yolk sac. The endometrium is thickened and heterogeneous containing heterogeneous debris. There is debris filling the endometrial canal, endometrial thickness of at least 2.8 cm. There is no internal blood flow to suggest retained products of conception. Normal blood flow seen to the myometrium.  The right ovary measures 3.1 x 2.1 x 2.1 cm with normal blood flow. The left ovary measures 3.1 x 2.1 x 3.8 cm and contains a probable corpus luteal cyst. Normal blood flow seen. There is trace pelvic free fluid. No adnexal mass.  IMPRESSION: Heterogeneous debris in the endometrial canal, likely blood products. No internal blood flow or findings to suggest retained products of conception.   Electronically Signed   By: Rubye OaksMelanie  Ehinger M.D.   On: 04/08/2014 22:10   [place] Ob Transvaginal  04/08/2014   CLINICAL DATA:  Missed abortion, post dilatation and evacuation 4 days prior. Pelvic pain and vaginal bleeding, increase vaginal bleeding for 2 days. Evaluate for retained products of conception.  EXAM: OBSTETRIC <14 WK US AND TRANSVAGINAL OB US  TECHNIQUE: Both transabdominal and transvaginal ultrasound examinations were performed for complete evaluation of the gestation as well as the maternal uterus, adnexal regions, and pelvic cul-de-sac. Transvaginal technique was performed to assess early pregnancy.  COMPARISON:  None.  FINDINGS: There is no intrauterine gestational sac,  fetal pole or yolk sac. The endometrium is thickened and heterogeneous containing heterogeneous debris. There is debris filling the endometrial canal, endometrial thickness of at least 2.8 cm. There is no internal blood flow to suggest retained products of conception. Normal blood flow seen to the myometrium.  The right ovary measures 3.1 x 2.1 x 2.1 cm with normal blood flow. The left ovary measures 3.1 x 2.1 x 3.8 cm and contains a probable corpus luteal cyst. Normal blood flow seen. There is trace pelvic free fluid. No adnexal mass.  IMPRESSION: Heterogeneous debris in the endometrial canal, likely blood products. No internal blood flow or findings to suggest retained products of conception.   Electronically Signed   By: Rubye OaksMelanie  Ehinger M.D.   On: 04/08/2014 22:10    MAU Course  Procedures None  MDM CBC  today Discussed with Dr. Ambrose Mantle. Recommends [place] tonight for possible retained POC.  US shows no evidence of retained products. Patient will be discharged to follow-up in the office as scheduled.   Assessment and Plan  A: POD#4 s/p D&E for missed AB Vaginal bleeding Abdominal pain  P: Discharge home Bleeding preacutions discussed Patient advised to follow-up with Dr. Ellyn Hack as scheduled for routine post-operative care or sooner PRN Patient may return to MAU as needed or if her condition were to change or worsen   Marny Lowenstein, PA-C  04/09/2014, 12:05 AM

## 2014-04-08 NOTE — MAU Note (Signed)
Pt reports she had a D&C on Friday and has had heavy bleeding since Sunday. States the bleeding has increased tonight and she is passing large clots. States she started having severe pain when she began passing the clots. Took a percocet about one hour ago.

## 2016-01-14 ENCOUNTER — Other Ambulatory Visit: Payer: Self-pay | Admitting: Physician Assistant

## 2016-01-14 ENCOUNTER — Other Ambulatory Visit: Payer: Self-pay | Admitting: Family Medicine

## 2016-01-14 DIAGNOSIS — R1010 Upper abdominal pain, unspecified: Secondary | ICD-10-CM

## 2016-01-21 ENCOUNTER — Ambulatory Visit
Admission: RE | Admit: 2016-01-21 | Discharge: 2016-01-21 | Disposition: A | Discharge status: Home / Self Care | Payer: BC Managed Care – PPO | Source: Ambulatory Visit | Attending: Physician Assistant | Admitting: Physician Assistant

## 2016-01-21 DIAGNOSIS — R1010 Upper abdominal pain, unspecified: Secondary | ICD-10-CM

## 2016-02-15 NOTE — L&D Delivery Note (Signed)
Delivery Note Pt pushed very well for about 30 minutes for delivery.  At 8:27 AM a viable and healthy female was delivered via Vaginal, Spontaneous (Presentation: ROA,; ROT ).  APGAR: 8, 9; weight P .   Placenta status: delivered, intact .  Cord: 3V with the following complications:  none.    Anesthesia:  epidural Episiotomy: None Lacerations: 2nd degree Suture Repair: 3.0 vicryl rapide Est. Blood Loss (mL): 200cc  Mom to postpartum.  Baby to Couplet care / Skin to Skin.  Kissy Cielo Bovard-Stuckert 12/28/2016, 9:01 AM  Br/A+/RI/Tdap in PNC/Contra?  D/w pt and FOB circumcision for female infant - inc r/b/a, will proceed

## 2016-06-14 LAB — OB RESULTS CONSOLE ABO/RH: RH TYPE: POSITIVE

## 2016-06-14 LAB — OB RESULTS CONSOLE RPR: RPR: NONREACTIVE

## 2016-06-14 LAB — OB RESULTS CONSOLE HEPATITIS B SURFACE ANTIGEN: HEP B S AG: NEGATIVE

## 2016-06-14 LAB — OB RESULTS CONSOLE GC/CHLAMYDIA
Chlamydia: NEGATIVE
Gonorrhea: NEGATIVE

## 2016-06-14 LAB — OB RESULTS CONSOLE HIV ANTIBODY (ROUTINE TESTING): HIV: NONREACTIVE

## 2016-06-14 LAB — OB RESULTS CONSOLE RUBELLA ANTIBODY, IGM: RUBELLA: IMMUNE

## 2016-06-14 LAB — OB RESULTS CONSOLE ANTIBODY SCREEN: Antibody Screen: NEGATIVE

## 2016-12-16 ENCOUNTER — Encounter (HOSPITAL_COMMUNITY): Payer: Self-pay | Admitting: *Deleted

## 2016-12-16 ENCOUNTER — Telehealth (HOSPITAL_COMMUNITY): Payer: Self-pay | Admitting: *Deleted

## 2016-12-16 LAB — OB RESULTS CONSOLE GBS: STREP GROUP B AG: NEGATIVE

## 2016-12-16 NOTE — Telephone Encounter (Signed)
Preadmission screen  

## 2016-12-26 MED ORDER — MISOPROSTOL 200 MCG PO TABS
50.0000 ug | ORAL_TABLET | Freq: Three times a day (TID) | ORAL | Status: DC
  Administered 2016-12-27 (×2): 50 ug via ORAL
  Filled 2016-12-26 (×3): qty 1

## 2016-12-26 NOTE — H&P (Signed)
Lorraine Garrett is a 31 y.o. female G2P0010 at 6537+ presenting for IOL secondary to cholestasis.  Pregnancy dated by LMP c/w early US.  +FM, no LOF, no VB, occ ctx.  Itching improved with ursodiol.  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  PT with h/o 12 wk SAB.  D/W pt and FOB IOL and process, questions answered, will proceed.  Received Tdap 10/19/16,  OB History    Gravida Para Term Preterm AB Living   2 0     1     SAB TAB Ectopic Multiple Live Births   1            no abn pap, last 08/2014 No STD G1 SAB, 12 wk D&C G2 present, cholestasis  Past Medical History:  Diagnosis Date  . Anxiety   . Asthma   . Bicornate uterus   . Cholestasis during pregnancy   . Depression   . Missed abortion 04/03/2014  . PONV (postoperative nausea and vomiting)   . Pregnancy induced hypertension   . S/P dilatation and curettage 04/04/2014   Past Surgical History:  Procedure Laterality Date  . DILATION AND CURETTAGE OF UTERUS    . TONSILLECTOMY    . WISDOM TOOTH EXTRACTION Bilateral 31yo    Family History: HTN, anxiety, CAD, hypercholesterolemia, DM, lung CA Social History: denies EtoH, tob or drug use; married, Runner, broadcasting/film/videoteacher Meds Lexapro, PNV, Ursodiol All ASA (NSAIDs OK)     Maternal Diabetes: No Genetic Screening: Normal Maternal Ultrasounds/Referrals: Normal Fetal Ultrasounds or other Referrals:  None Maternal Substance Abuse:  No Significant Maternal Medications:  None Significant Maternal Lab Results:  Lab values include: Group B Strep negative Other Comments:  Cholestasis - on ursodiol  Review of Systems  Constitutional: Negative.   HENT: Negative.   Eyes: Negative.   Respiratory: Negative.   Cardiovascular: Negative.   Gastrointestinal: Negative.   Genitourinary: Negative.   Musculoskeletal: Positive for back pain.  Skin: Negative.   Neurological: Negative.    Psychiatric/Behavioral: Negative.    Maternal Medical History:  Contractions: Frequency: irregular.   Perceived severity is moderate.    Fetal activity: Perceived fetal activity is normal.    Prenatal complications: PIH.   Maternal HTN; cholestasis  Prenatal Complications - Diabetes: none.      Last menstrual period 04/09/2016, unknown if currently breastfeeding. Maternal Exam:  Uterine Assessment: Contraction strength is moderate.  Contraction frequency is irregular.   Abdomen: Fundal height is appropriate for gestation.   Estimated fetal weight is 6-7#.   Fetal presentation: vertex  Introitus: Normal vulva. Normal vagina.  Cervix: Cervix evaluated by digital exam.     Physical Exam  Constitutional: She is oriented to person, place, and time. She appears well-developed and well-nourished.  HENT:  Head: Normocephalic and atraumatic.  Cardiovascular: Normal rate and regular rhythm.  Respiratory: Effort normal and breath sounds normal. No respiratory distress. She has no wheezes.  GI: Soft. Bowel sounds are normal. She exhibits no distension. There is no tenderness.  Musculoskeletal: Normal range of motion.  Neurological: She is alert and oriented to person, place, and time.  Skin: Skin is warm and dry.  Psychiatric: She has a normal mood and affect. Her behavior is normal.    Prenatal labs: ABO, Rh: A/Positive/-- (05/01 0000) Antibody: Negative (05/01 0000) Rubella: Immune (05/01 0000) RPR: Nonreactive (05/01 0000)  HBsAg: Negative (05/01 0000)  HIV: Non-reactive (05/01  0000)  GBS:   neg  Hgb 13.4/Plt 292/Ur Cx neg/ GC neg/Chl neg/Varicella immune/First tri screen WNL, nl NT; glucola 138 - nl 3hr GTT/bile acids 13 - 26; nl LFTs/Cf neg  Assessment/Plan: 31yo G2 P0010 at 37+ for IOL given cholestasis VTX by Leopold's and last US gbbs neg - no prophylaxis IOL with cytotec po and PV, also pitocin Epidural, IV pain meds, or nitrous for pain control Circumcision  paid for at hospital   Yonael Tulloch Bovard-Stuckert 12/26/2016, 9:18 PM

## 2016-12-27 ENCOUNTER — Inpatient Hospital Stay (HOSPITAL_COMMUNITY): Payer: BC Managed Care – PPO | Admitting: Anesthesiology

## 2016-12-27 ENCOUNTER — Encounter (HOSPITAL_COMMUNITY): Payer: Self-pay | Admitting: Anesthesiology

## 2016-12-27 ENCOUNTER — Inpatient Hospital Stay (HOSPITAL_COMMUNITY)
Admission: RE | Admit: 2016-12-27 | Discharge: 2016-12-30 | DRG: 805 | Disposition: A | Discharge status: Home / Self Care | Payer: BC Managed Care – PPO | Source: Ambulatory Visit | Attending: Obstetrics and Gynecology | Admitting: Obstetrics and Gynecology

## 2016-12-27 ENCOUNTER — Encounter (HOSPITAL_COMMUNITY): Payer: Self-pay

## 2016-12-27 DIAGNOSIS — O99344 Other mental disorders complicating childbirth: Secondary | ICD-10-CM | POA: Diagnosis present

## 2016-12-27 DIAGNOSIS — Z3A37 37 weeks gestation of pregnancy: Secondary | ICD-10-CM | POA: Diagnosis not present

## 2016-12-27 DIAGNOSIS — Q513 Bicornate uterus: Secondary | ICD-10-CM | POA: Diagnosis not present

## 2016-12-27 DIAGNOSIS — O26613 Liver and biliary tract disorders in pregnancy, third trimester: Secondary | ICD-10-CM

## 2016-12-27 DIAGNOSIS — K831 Obstruction of bile duct: Secondary | ICD-10-CM | POA: Diagnosis present

## 2016-12-27 DIAGNOSIS — F419 Anxiety disorder, unspecified: Secondary | ICD-10-CM | POA: Diagnosis present

## 2016-12-27 DIAGNOSIS — O2662 Liver and biliary tract disorders in childbirth: Secondary | ICD-10-CM | POA: Diagnosis present

## 2016-12-27 DIAGNOSIS — O26643 Intrahepatic cholestasis of pregnancy, third trimester: Secondary | ICD-10-CM | POA: Diagnosis present

## 2016-12-27 DIAGNOSIS — O3403 Maternal care for unspecified congenital malformation of uterus, third trimester: Secondary | ICD-10-CM | POA: Diagnosis present

## 2016-12-27 LAB — CBC
HCT: 32.9 % — ABNORMAL LOW (ref 36.0–46.0)
HEMATOCRIT: 32.6 % — AB (ref 36.0–46.0)
HEMOGLOBIN: 11 g/dL — AB (ref 12.0–15.0)
Hemoglobin: 10.9 g/dL — ABNORMAL LOW (ref 12.0–15.0)
MCH: 29.2 pg (ref 26.0–34.0)
MCH: 29.1 pg (ref 26.0–34.0)
MCHC: 33.7 g/dL (ref 30.0–36.0)
MCHC: 33.1 g/dL (ref 30.0–36.0)
MCV: 86.5 fL (ref 78.0–100.0)
MCV: 87.7 fL (ref 78.0–100.0)
Platelets: 265 10*3/uL (ref 150–400)
Platelets: 244 10*3/uL (ref 150–400)
RBC: 3.77 MIL/uL — ABNORMAL LOW (ref 3.87–5.11)
RBC: 3.75 MIL/uL — AB (ref 3.87–5.11)
RDW: 13 % (ref 11.5–15.5)
RDW: 13.2 % (ref 11.5–15.5)
WBC: 9.7 10*3/uL (ref 4.0–10.5)
WBC: 13.2 10*3/uL — ABNORMAL HIGH (ref 4.0–10.5)

## 2016-12-27 LAB — TYPE AND SCREEN
ABO/RH(D): A POS
ABO/RH(D): A POS
ANTIBODY SCREEN: NEGATIVE
Antibody Screen: NEGATIVE

## 2016-12-27 LAB — RPR: RPR: NONREACTIVE

## 2016-12-27 MED ORDER — OXYTOCIN BOLUS FROM INFUSION
500.0000 mL | Freq: Once | INTRAVENOUS | Status: AC
  Administered 2016-12-28: 500 mL via INTRAVENOUS

## 2016-12-27 MED ORDER — ACETAMINOPHEN 325 MG PO TABS: 650.0000 mg | ORAL_TABLET | ORAL | Status: DC | PRN

## 2016-12-27 MED ORDER — OXYTOCIN 40 UNITS IN LACTATED RINGERS INFUSION - SIMPLE MED
1.0000 m[IU]/min | INTRAVENOUS | Status: DC
  Administered 2016-12-27: 2 m[IU]/min via INTRAVENOUS
  Filled 2016-12-27: qty 1000

## 2016-12-27 MED ORDER — MISOPROSTOL 25 MCG QUARTER TABLET
25.0000 ug | ORAL_TABLET | Freq: Three times a day (TID) | ORAL | Status: DC
  Administered 2016-12-27 (×2): 25 ug via VAGINAL
  Filled 2016-12-27 (×3): qty 1

## 2016-12-27 MED ORDER — OXYTOCIN 40 UNITS IN LACTATED RINGERS INFUSION - SIMPLE MED: 2.5000 [IU]/h | INTRAVENOUS | Status: DC

## 2016-12-27 MED ORDER — LACTATED RINGERS IV SOLN: 500.0000 mL | Freq: Once | INTRAVENOUS | Status: DC

## 2016-12-27 MED ORDER — DIPHENHYDRAMINE HCL 50 MG/ML IJ SOLN
INTRAMUSCULAR | Status: AC
Start: 2016-12-27 — End: 2016-12-28
  Filled 2016-12-27: qty 1

## 2016-12-27 MED ORDER — PHENYLEPHRINE 40 MCG/ML (10ML) SYRINGE FOR IV PUSH (FOR BLOOD PRESSURE SUPPORT)
80.0000 ug | PREFILLED_SYRINGE | INTRAVENOUS | Status: DC | PRN
  Filled 2016-12-27: qty 5
  Filled 2016-12-27: qty 10

## 2016-12-27 MED ORDER — EPHEDRINE 5 MG/ML INJ
10.0000 mg | INTRAVENOUS | Status: DC | PRN
  Filled 2016-12-27: qty 2

## 2016-12-27 MED ORDER — LACTATED RINGERS IV SOLN
INTRAVENOUS | Status: DC
  Administered 2016-12-27 (×3): via INTRAVENOUS

## 2016-12-27 MED ORDER — BUTORPHANOL TARTRATE 1 MG/ML IJ SOLN
1.0000 mg | INTRAMUSCULAR | Status: DC | PRN
  Administered 2016-12-27 (×2): 1 mg via INTRAVENOUS
  Filled 2016-12-27 (×2): qty 1

## 2016-12-27 MED ORDER — OXYCODONE-ACETAMINOPHEN 5-325 MG PO TABS: 1.0000 | ORAL_TABLET | ORAL | Status: DC | PRN

## 2016-12-27 MED ORDER — LIDOCAINE HCL (PF) 1 % IJ SOLN
30.0000 mL | INTRAMUSCULAR | Status: DC | PRN
  Filled 2016-12-27: qty 30

## 2016-12-27 MED ORDER — LIDOCAINE HCL (PF) 1 % IJ SOLN
INTRAMUSCULAR | Status: DC | PRN
  Administered 2016-12-27 (×2): 5 mL

## 2016-12-27 MED ORDER — FENTANYL 2.5 MCG/ML BUPIVACAINE 1/10 % EPIDURAL INFUSION (WH - ANES)
14.0000 mL/h | INTRAMUSCULAR | Status: DC | PRN
  Administered 2016-12-27 – 2016-12-28 (×2): 14 mL/h via EPIDURAL
  Filled 2016-12-27 (×2): qty 100

## 2016-12-27 MED ORDER — DIPHENHYDRAMINE HCL 50 MG/ML IJ SOLN: 12.5000 mg | INTRAMUSCULAR | Status: DC | PRN

## 2016-12-27 MED ORDER — SOD CITRATE-CITRIC ACID 500-334 MG/5ML PO SOLN: 30.0000 mL | ORAL | Status: DC | PRN

## 2016-12-27 MED ORDER — TERBUTALINE SULFATE 1 MG/ML IJ SOLN
0.2500 mg | Freq: Once | INTRAMUSCULAR | Status: DC | PRN
  Filled 2016-12-27: qty 1

## 2016-12-27 MED ORDER — PHENYLEPHRINE 40 MCG/ML (10ML) SYRINGE FOR IV PUSH (FOR BLOOD PRESSURE SUPPORT)
80.0000 ug | PREFILLED_SYRINGE | INTRAVENOUS | Status: DC | PRN
  Filled 2016-12-27: qty 5

## 2016-12-27 MED ORDER — OXYCODONE-ACETAMINOPHEN 5-325 MG PO TABS: 2.0000 | ORAL_TABLET | ORAL | Status: DC | PRN

## 2016-12-27 MED ORDER — ONDANSETRON HCL 4 MG/2ML IJ SOLN
4.0000 mg | Freq: Four times a day (QID) | INTRAMUSCULAR | Status: DC | PRN
  Administered 2016-12-27 – 2016-12-28 (×2): 4 mg via INTRAVENOUS
  Filled 2016-12-27 (×2): qty 2

## 2016-12-27 MED ORDER — MISOPROSTOL 25 MCG QUARTER TABLET
25.0000 ug | ORAL_TABLET | Freq: Three times a day (TID) | ORAL | Status: DC | PRN
  Filled 2016-12-27: qty 1

## 2016-12-27 MED ORDER — LACTATED RINGERS IV SOLN
500.0000 mL | INTRAVENOUS | Status: DC | PRN
  Administered 2016-12-27: 300 mL via INTRAVENOUS
  Administered 2016-12-27: 200 mL via INTRAVENOUS

## 2016-12-27 MED ORDER — DIPHENHYDRAMINE HCL 50 MG/ML IJ SOLN
12.5000 mg | Freq: Once | INTRAMUSCULAR | Status: AC
  Administered 2016-12-27: 12.5 mg via INTRAVENOUS

## 2016-12-27 NOTE — Progress Notes (Signed)
Patient ID: Lorraine Garrett, female   DOB: 18-Aug-1985, 31 y.o.   MRN: [id number]   Feeling some ctx.  Frustrated, but still excited  AFVSS gen NAD FHTs 120-125, mod var, + accels, category 1 toco Q 2min  Foley bulb placed, w/o diff or complication - 60cc in bulb  SVE FT/50/-3  Continue IOL, cytotec when ctx space.

## 2016-12-27 NOTE — Progress Notes (Signed)
Patient ID: Lorraine Garrett, female   DOB: 03/18/1985, 31 y.o.   MRN: [id number]   H&P reviewed, no changes.  D/w pt POC  AFVSS Gen NAD FHTs 120-125, mod var, + accels, category 1 toco Q 2-375min  Received cytotec at 2:40am - 50mcg po, not repeated at 6:40, contracting too much  Will monitor closely SVE cl-FT/50/-3, vtx by exam  Continue IOL

## 2016-12-27 NOTE — Anesthesia Preprocedure Evaluation (Signed)
Anesthesia Evaluation  Patient identified by MRN, date of birth, ID band Patient awake    Reviewed: Allergy & Precautions, H&P , NPO status , Patient's Chart, lab work & pertinent test results  History of Anesthesia Complications Negative for: history of anesthetic complications  Airway Mallampati: II  TM Distance: >3 FB Neck ROM: full    Dental no notable dental hx. (+) Teeth Intact   Pulmonary neg pulmonary ROS,    Pulmonary exam normal breath sounds clear to auscultation       Cardiovascular hypertension (PIH), negative cardio ROS Normal cardiovascular exam Rhythm:regular Rate:Normal     Neuro/Psych negative neurological ROS  negative psych ROS   GI/Hepatic negative GI ROS, Neg liver ROS,   Endo/Other  negative endocrine ROS  Renal/GU negative Renal ROS  negative genitourinary   Musculoskeletal   Abdominal   Peds  Hematology negative hematology ROS (+)   Anesthesia Other Findings   Reproductive/Obstetrics (+) Pregnancy                             Anesthesia Physical Anesthesia Plan  ASA: II  Anesthesia Plan: Epidural   Post-op Pain Management:    Induction:   PONV Risk Score and Plan:   Airway Management Planned:   Additional Equipment:   Intra-op Plan:   Post-operative Plan:   Informed Consent: I have reviewed the patients History and Physical, chart, labs and discussed the procedure including the risks, benefits and alternatives for the proposed anesthesia with the patient or authorized representative who has indicated his/her understanding and acceptance.     Plan Discussed with:   Anesthesia Plan Comments:         Anesthesia Quick Evaluation

## 2016-12-27 NOTE — Anesthesia Pain Management Evaluation Note (Signed)
  CRNA Pain Management Visit Note  Patient: Lorraine Garrett, 31 y.o., female  "Hello I am a [facility] of the anesthesia [facility]. We have an anesthesia team available at all times to provide care throughout the hospital, including epidural management and anesthesia for C-section. I don't know your plan for the delivery whether it a natural birth, water birth, IV sedation, nitrous supplementation, doula or epidural, but we want to meet your pain goals."   1.Was your pain managed to your expectations on prior hospitalizations?   No prior hospitalizations  2.What is your expectation for pain management during this hospitalization?     Epidural, IV pain meds and Nitrous Oxide  3.How can we help you reach that goal? Be available  Record the patient's initial score and the patient's pain goal.   Pain: 0  Pain Goal: 5 [facility] wants you to be able to say your pain was always managed very well.  [facility] MargaretMERRITT,Tyrin Herbers 12/27/2016

## 2016-12-27 NOTE — Progress Notes (Signed)
Patient ID: Lorraine Garrett, female   DOB: Sep 15, 1985, 31 y.o.   MRN: [id number]   Pt comfortable with epidural.  AFVSS gen NAD FHTs 120's, mod var, occ accels, category 1 toco q 2-745min  SVE 1.2/70/-2  AROM for clear fluid.  W/o diff/comp  Continue IOL, pitocin

## 2016-12-27 NOTE — Anesthesia Procedure Notes (Signed)
Epidural Patient location during procedure: OB  Staffing Anesthesiologist: Julianna Vanwagner, MD Performed: anesthesiologist   Preanesthetic Checklist Completed: patient identified, site marked, surgical consent, pre-op evaluation, timeout performed, IV checked, risks and benefits discussed and monitors and equipment checked  Epidural Patient position: sitting Prep: DuraPrep Patient monitoring: heart rate, continuous pulse ox and blood pressure Approach: right paramedian Location: L3-L4 Injection technique: LOR saline  Needle:  Needle type: Tuohy  Needle gauge: 17 G Needle length: 9 cm and 9 Needle insertion depth: 5 cm Catheter type: closed end flexible Catheter size: 20 Guage Catheter at skin depth: 9 cm Test dose: negative  Assessment Events: blood not aspirated, injection not painful, no injection resistance, negative IV test and no paresthesia  Additional Notes Patient identified. Risks/Benefits/Options discussed with patient including but not limited to bleeding, infection, nerve damage, paralysis, failed block, incomplete pain control, headache, blood pressure changes, nausea, vomiting, reactions to medication both or allergic, itching and postpartum back pain. Confirmed with bedside nurse the patient's most recent platelet count. Confirmed with patient that they are not currently taking any anticoagulation, have any bleeding history or any family history of bleeding disorders. Patient expressed understanding and wished to proceed. All questions were answered. Sterile technique was used throughout the entire procedure. Please see nursing notes for vital signs. Test dose was given through epidural needle and negative prior to continuing to dose epidural or start infusion. Warning signs of high block given to the patient including shortness of breath, tingling/numbness in hands, complete motor block, or any concerning symptoms with instructions to call for help. Patient was given  instructions on fall risk and not to get out of bed. All questions and concerns addressed with instructions to call with any issues.     

## 2016-12-28 ENCOUNTER — Encounter (HOSPITAL_COMMUNITY): Payer: Self-pay

## 2016-12-28 LAB — CBC
HEMATOCRIT: 31.9 % — AB (ref 36.0–46.0)
HEMOGLOBIN: 10.5 g/dL — AB (ref 12.0–15.0)
MCH: 28.8 pg (ref 26.0–34.0)
MCHC: 32.9 g/dL (ref 30.0–36.0)
MCV: 87.4 fL (ref 78.0–100.0)
Platelets: 206 10*3/uL (ref 150–400)
RBC: 3.65 MIL/uL — ABNORMAL LOW (ref 3.87–5.11)
RDW: 13.2 % (ref 11.5–15.5)
WBC: 16 10*3/uL — ABNORMAL HIGH (ref 4.0–10.5)

## 2016-12-28 MED ORDER — TETANUS-DIPHTH-ACELL PERTUSSIS 5-2.5-18.5 LF-MCG/0.5 IM SUSP: 0.5000 mL | Freq: Once | INTRAMUSCULAR | Status: DC

## 2016-12-28 MED ORDER — OXYCODONE HCL 5 MG PO TABS: 5.0000 mg | ORAL_TABLET | ORAL | Status: DC | PRN

## 2016-12-28 MED ORDER — ZOLPIDEM TARTRATE 5 MG PO TABS: 5.0000 mg | ORAL_TABLET | Freq: Every evening | ORAL | Status: DC | PRN

## 2016-12-28 MED ORDER — ONDANSETRON HCL 4 MG/2ML IJ SOLN: 4.0000 mg | INTRAMUSCULAR | Status: DC | PRN

## 2016-12-28 MED ORDER — WITCH HAZEL-GLYCERIN EX PADS: 1.0000 "application " | MEDICATED_PAD | CUTANEOUS | Status: DC | PRN

## 2016-12-28 MED ORDER — BENZOCAINE-MENTHOL 20-0.5 % EX AERO
1.0000 "application " | INHALATION_SPRAY | CUTANEOUS | Status: DC | PRN
  Administered 2016-12-28 – 2016-12-30 (×2): 1 via TOPICAL
  Filled 2016-12-28 (×2): qty 56

## 2016-12-28 MED ORDER — SENNOSIDES-DOCUSATE SODIUM 8.6-50 MG PO TABS
2.0000 | ORAL_TABLET | ORAL | Status: DC
  Administered 2016-12-29 – 2016-12-30 (×2): 2 via ORAL
  Filled 2016-12-28 (×2): qty 2

## 2016-12-28 MED ORDER — ACETAMINOPHEN 325 MG PO TABS: 650.0000 mg | ORAL_TABLET | ORAL | Status: DC | PRN

## 2016-12-28 MED ORDER — OXYCODONE HCL 5 MG PO TABS: 10.0000 mg | ORAL_TABLET | ORAL | Status: DC | PRN

## 2016-12-28 MED ORDER — LACTATED RINGERS IV SOLN: INTRAVENOUS | Status: DC

## 2016-12-28 MED ORDER — DIPHENHYDRAMINE HCL 25 MG PO CAPS
25.0000 mg | ORAL_CAPSULE | Freq: Four times a day (QID) | ORAL | Status: DC | PRN
Start: 2016-12-28 — End: 2016-12-30

## 2016-12-28 MED ORDER — SIMETHICONE 80 MG PO CHEW: 80.0000 mg | CHEWABLE_TABLET | ORAL | Status: DC | PRN

## 2016-12-28 MED ORDER — PRENATAL MULTIVITAMIN CH
1.0000 | ORAL_TABLET | Freq: Every day | ORAL | Status: DC
  Administered 2016-12-29: 1 via ORAL
  Filled 2016-12-28: qty 1

## 2016-12-28 MED ORDER — IBUPROFEN 600 MG PO TABS
600.0000 mg | ORAL_TABLET | Freq: Four times a day (QID) | ORAL | Status: DC
  Administered 2016-12-28 – 2016-12-30 (×8): 600 mg via ORAL
  Filled 2016-12-28 (×8): qty 1

## 2016-12-28 MED ORDER — COCONUT OIL OIL
1.0000 "application " | TOPICAL_OIL | Status: DC | PRN
  Administered 2016-12-30: 1 via TOPICAL
  Filled 2016-12-28: qty 120

## 2016-12-28 MED ORDER — ONDANSETRON HCL 4 MG PO TABS: 4.0000 mg | ORAL_TABLET | ORAL | Status: DC | PRN

## 2016-12-28 MED ORDER — DIBUCAINE 1 % RE OINT: 1.0000 "application " | TOPICAL_OINTMENT | RECTAL | Status: DC | PRN

## 2016-12-28 NOTE — Progress Notes (Signed)
Patient ID: Lorraine Garrett, female   DOB: 1985-07-31, 31 y.o.   MRN: [id number]   Feeling ctx, will give extra medicine in epidural  AFVSS gen NAD FHTs 130-140, mod var, + accels, category 1 toco Q 2min  SVE 3/90/0-1  Continue IOL, pitocin at 10mU, expect SVD

## 2016-12-28 NOTE — Anesthesia Postprocedure Evaluation (Signed)
Anesthesia Post Note  Patient: Adult nurseTaryn Garrett  Procedure(s) Performed: AN AD HOC LABOR EPIDURAL     Patient location during evaluation: Mother Baby Anesthesia Type: Epidural Level of consciousness: awake and alert and oriented Pain management: satisfactory to patient Vital Signs Assessment: post-procedure vital signs reviewed and stable Respiratory status: respiratory function stable Cardiovascular status: stable Postop Assessment: no headache, no backache, epidural receding, patient able to bend at knees, no signs of nausea or vomiting and adequate PO intake Anesthetic complications: no    Last Vitals:  Vitals:   12/28/16 1030 12/28/16 1130  BP: 134/65 118/66  Pulse: 75 90  Resp: 20 18  Temp: 37 C 37.3 C  SpO2:      Last Pain:  Vitals:   12/28/16 1130  TempSrc: Oral  PainSc: 0-No pain   Pain Goal: Patients Stated Pain Goal: 2 (12/28/16 1016)               [name]

## 2016-12-28 NOTE — Progress Notes (Signed)
Patient ID: Lorraine Garrett, female   DOB: [birth year], 31 y.o.   MRN: [id number]   Some ctx/pressure in front of abdomen  AFVSS gen NAD FHTs 140's mod var, some variable/early decels, category 1-2 toco q 2-3 min  SVE 10/100/+2  Will start pushing.  Expect SVD

## 2016-12-28 NOTE — Lactation Note (Signed)
This note was copied from a baby's chart. Lactation Consultation Note  Patient Name: Lorraine Garrett ZOXWR'UToday's Date: 12/28/2016 Reason for consult: Initial assessment;Primapara;Early term 37-38.6wks;1st time breastfeeding Breastfeeding consultation services and support information given to patient.  This is mom's first baby and newborn is 316 hours old.  Baby is spitting small amounts of clear liquid and bulb syringe used.  Mom states baby latched in birthing suites.  Baby sleepy at present.  Assisted with positioning skin to skin in cross cradle hold. Baby remains sleepy with few cues noted.  He did latch for a few sucks but then lost interest.  Normal newborn behavior discussed.  Mom shown hand expression and colostrum easily expressed.  Encouraged to call for assist prn.  Maternal Data Has patient been taught Hand Expression?: Yes Does the patient have breastfeeding experience prior to this delivery?: No  Feeding    LATCH Score Latch: Too sleepy or reluctant, no latch achieved, no sucking elicited.  Audible Swallowing: None  Type of Nipple: Everted at rest and after stimulation  Comfort (Breast/Nipple): Soft / non-tender  Hold (Positioning): Assistance needed to correctly position infant at breast and maintain latch.  LATCH Score: 5  Interventions Interventions: Breast feeding basics reviewed;Assisted with latch;Breast compression;Skin to skin;Adjust position;Breast massage;Support pillows;Hand express;Position options  Lactation Tools Discussed/Used     Consult Status Consult Status: Follow-up Date: 12/29/16 Follow-up type: In-patient    Huston FoleyMOULDEN, Hadassa Cermak S 12/28/2016, 3:06 PM

## 2016-12-29 LAB — CBC
HEMATOCRIT: 28 % — AB (ref 36.0–46.0)
Hemoglobin: 9.5 g/dL — ABNORMAL LOW (ref 12.0–15.0)
MCH: 29.9 pg (ref 26.0–34.0)
MCHC: 33.9 g/dL (ref 30.0–36.0)
MCV: 88.1 fL (ref 78.0–100.0)
Platelets: 180 10*3/uL (ref 150–400)
RBC: 3.18 MIL/uL — AB (ref 3.87–5.11)
RDW: 13.4 % (ref 11.5–15.5)
WBC: 14.3 10*3/uL — AB (ref 4.0–10.5)

## 2016-12-29 LAB — BIRTH TISSUE RECOVERY COLLECTION (PLACENTA DONATION)

## 2016-12-29 NOTE — Lactation Note (Signed)
This note was copied from a baby's chart. Lactation Consultation Note  Patient Name: Lorraine Garrett UJWJX'BToday's Date: 12/29/2016 Reason for consult: Follow-up assessment  Baby is 7335 hours old and mom requested a LC visit.  When LC entered the room, per mom and dad he had fed on the  Right breast / football, and he wasn't latching on the left breast football.  LC recommending burping him 1st to move the gas down, and assisted  Mom to use the cradle modified laid back position and the baby latched well  And fed for 25 mins with multiple swallows, increased with breast compressions,  And LC reviewed hand expressing after wards.  LC instructed mom on the use hand pump [facility], reviewed sore nipple and engorgement  Prevention and tx. Per mom will have a DEBP Medela at home.  Mom and dad asking questions about when to start a bottle to transition for work.  LC reviewed process and if the baby is gaining well, to wait until the 4 week PP. ( 4-6 weeks) .  Also discussed early pumping if the baby only feeds one breast , pumping off to comfort, Both mom and dad receptive to teaching, and expressed appreciation .   Maternal Data Has patient been taught Hand Expression?: Yes  Feeding Feeding Type: Breast Fed(left breast / cross cradle ) Length of feed: 25 min(multiple swallows, incw/ breast compressions mom comfortable)  LATCH Score Latch: Grasps breast easily, tongue down, lips flanged, rhythmical sucking.  Audible Swallowing: Spontaneous and intermittent  Type of Nipple: Everted at rest and after stimulation  Comfort (Breast/Nipple): Soft / non-tender  Hold (Positioning): Assistance needed to correctly position infant at breast and maintain latch.  LATCH Score: 9  Interventions Interventions: Hand express;Hand pump  Lactation Tools Discussed/Used Tools: Pump Breast pump type: Manual WIC Program: No Pump Review: Milk Storage;Setup, frequency, and cleaning Initiated by:: MAI   Date initiated:: 12/29/16   Consult Status Consult Status: Follow-up Date: 12/30/16 Follow-up type: In-patient    Lorraine Garrett 12/29/2016, 8:25 PM

## 2016-12-29 NOTE — Progress Notes (Signed)
PPD #1 No problems Afeb, VSS Fundus firm, NT at U-1 Continue routine postpartum care 

## 2016-12-30 MED ORDER — IBUPROFEN 600 MG PO TABS: 600.0000 mg | ORAL_TABLET | Freq: Four times a day (QID) | ORAL | 1 refills | Status: AC | PRN

## 2016-12-30 MED ORDER — PRENATAL MULTIVITAMIN CH: 1.0000 | ORAL_TABLET | Freq: Every day | ORAL | 3 refills | Status: AC

## 2016-12-30 NOTE — Progress Notes (Signed)
Post Partum Day 2 Subjective: no complaints, up ad lib, voiding, tolerating PO and nl lochia, pain controlled  Objective: Blood pressure 122/75, pulse 82, temperature 98.3 F (36.8 C), temperature source Oral, resp. rate 18, height 5\' 3"  (1.6 m), weight 79.4 kg (175 lb), last menstrual period 04/09/2016, SpO2 98 %, unknown if currently breastfeeding.  Physical Exam:  General: alert and no distress Lochia: appropriate Uterine Fundus: firm   Recent Labs    12/28/16 0908 12/29/16 0653  HGB 10.5* 9.5*  HCT 31.9* 28.0*    Assessment/Plan: Discharge home, Breastfeeding and Lactation consult.  Routine PP care.  D/c with motrin and PNV, f/u 6 weeks   LOS: 3 days   Lorraine Garrett 12/30/2016, 7:07 AM

## 2016-12-30 NOTE — Discharge Summary (Signed)
OB Discharge Summary     Patient Name: Lorraine Garrett DOB: 1985-07-11 MRN: [id number]  Date of admission: 12/27/2016 Delivering MD: Sherian ReinBOVARD-STUCKERT, Rendy Lazard   Date of discharge: 12/30/2016  Admitting diagnosis: INDUCTION Intrauterine pregnancy: 5821w4d     Secondary diagnosis:  Active Problems:   Cholestasis during pregnancy in third trimester  Additional problems: N/A     Discharge diagnosis: Term Pregnancy Delivered                                                                                                Post partum procedures:N/A  Augmentation: AROM, Pitocin and Foley Balloon  Complications: None  Hospital course:  Induction of Labor With Vaginal Delivery   31 y.o. yo G2P1011 at 6921w4d was admitted to the hospital 12/27/2016 for induction of labor.  Indication for induction: Cholestasis of pregnancy.  Patient had an uncomplicated labor course as follows: Membrane Rupture Time/Date: 9:32 PM ,12/27/2016   Intrapartum Procedures: Episiotomy: None [1]                                         Lacerations:  2nd degree [3]  Patient had delivery of a Viable infant.  Information for the patient's newborn:  Gerilyn Pilgrimuckett, Boy Tulip [[id number]][[id number]]  Delivery Method: Vag-Spont   12/28/2016  Details of delivery can be found in separate delivery note.  Patient had a routine postpartum course. Patient is discharged home 12/30/16.  Physical exam  Vitals:   12/29/16 0037 12/29/16 0525 12/29/16 1824 12/30/16 0530  BP: 123/67 115/62 (!) 141/72 122/75  Pulse: 83 76 (!) 109 82  Resp: 18 18 18 18   Temp: 97.6 F (36.4 C) (!) 97.4 F (36.3 C) (!) 97.4 F (36.3 C) 98.3 F (36.8 C)  TempSrc: Oral Oral Oral Oral  SpO2:      Weight:      Height:       General: alert and no distress Lochia: appropriate Uterine Fundus: firm  Labs: Lab Results  Component Value Date   WBC 14.3 (H) 12/29/2016   HGB 9.5 (L) 12/29/2016   HCT 28.0 (L) 12/29/2016   MCV 88.1 12/29/2016   PLT 180 12/29/2016   No  flowsheet data found.  Discharge instruction: per After Visit Summary and "Baby and Me Booklet".  After visit meds:  Allergies as of 12/30/2016      Reactions   Aspirin Other (See Comments)   Asthma attack      Medication List    STOP taking these medications   ursodiol 250 MG tablet Commonly known as:  ACTIGALL     TAKE these medications   acetaminophen 325 MG tablet Commonly known as:  TYLENOL Take 650 mg by mouth every 6 (six) hours as needed for headache.   ibuprofen 600 MG tablet Commonly known as:  ADVIL,MOTRIN Take 1 tablet (600 mg total) every 6 (six) hours as needed by mouth.   prenatal multivitamin Tabs tablet Take 1 tablet daily at 12 noon by mouth.  Diet: routine diet  Activity: Advance as tolerated. Pelvic rest for 6 weeks.   Outpatient follow up:6 weeks Follow up Appt:No future appointments. Follow up Visit:No Follow-up on file.  Postpartum contraception: Undecided  Newborn Data: Live born female  Birth Weight: 6 lb 6.1 oz (2895 g) APGAR: 8, 9  Newborn Delivery   Birth date/time:  12/28/2016 08:27:00 Delivery type:  Vaginal, Spontaneous     Baby Feeding: Breast Disposition:home with mother   12/30/2016 Sherian ReinJody Bovard-Stuckert, MD

## 2016-12-30 NOTE — Lactation Note (Signed)
This note was copied from a baby's chart. Lactation Consultation Note  Patient Name: Boy Kaylyn Limaryn Struckman WUJWJ'XToday's Date: 12/30/2016 Reason for consult: Follow-up assessment;Early term 37-38.6wks;1st time breastfeeding  Follow up visit at 50 hours of age.  Mom reports just latching baby now and is concerned if baby is getting enough.  Baby has had 7 breast feedings with 4 voids and 2 stools in past 24 hours.  Weight loss of 7% at 5#15.1oz at 1969w4d.  Previous LATCH scores of "9" Mom holding baby in cross cradle hold.  Baby with wide gape and good strong sucking with large jaw excursions.  Baby needs some stimulation to maintain feeding with FOB and mom attentive during feeding.   LC instructed mom on how to watch and listen for swallows.  Baby remained on breast for 15 minute feeding and then fell asleep.  LC assisted mom with re-latching a few times during feeding with colostrum easily expressed. LC encouraged mom to allow breast in natural position and bring baby to breast with pillow support.  Mom encouraged to compress breasts during feeding. Discussed milk transitioning to larger volume, engorgement care discussed.  Encouraged frequent feedings 8-12x/day. Mom to soften breast as needed prior to latch.  Mom aware of o/p services and encouraged to schedule LC consult to assess feeding as milk supply increases.     Maternal Data Has patient been taught Hand Expression?: Yes Does the patient have breastfeeding experience prior to this delivery?: No  Feeding Feeding Type: Breast Fed Length of feed: 15 min  LATCH Score Latch: Grasps breast easily, tongue down, lips flanged, rhythmical sucking.  Audible Swallowing: A few with stimulation  Type of Nipple: Everted at rest and after stimulation  Comfort (Breast/Nipple): Soft / non-tender  Hold (Positioning): No assistance needed to correctly position infant at breast.  LATCH Score: 9  Interventions Interventions: Breast feeding basics  reviewed;Adjust position;Assisted with latch;Support pillows;Skin to skin;Position options;Breast massage;Hand express;Expressed milk;Breast compression  Lactation Tools Discussed/Used     Consult Status Consult Status: Complete    Franz DellJana Shoptaw 12/30/2016, 11:07 AM

## 2017-01-14 ENCOUNTER — Inpatient Hospital Stay (HOSPITAL_COMMUNITY)
Admission: AD | Admit: 2017-01-14 | Payer: BC Managed Care – PPO | Source: Ambulatory Visit | Admitting: Obstetrics and Gynecology

## 2017-04-26 ENCOUNTER — Ambulatory Visit (HOSPITAL_COMMUNITY): Payer: BC Managed Care – PPO | Admitting: Psychiatry

## 2017-06-24 IMAGING — US US ABDOMEN COMPLETE
1 series · 14 of 25 positions shown · non-contrast
Comparison: KUB 12/21/2015.  Ultrasound 09/17/2008.

CLINICAL DATA: Abdominal pain .

EXAM:
ABDOMEN ULTRASOUND COMPLETE

[Series 1: us abdomen complete · 0.32mm/px · 14 of 77 slices shown]
[im 1/77]
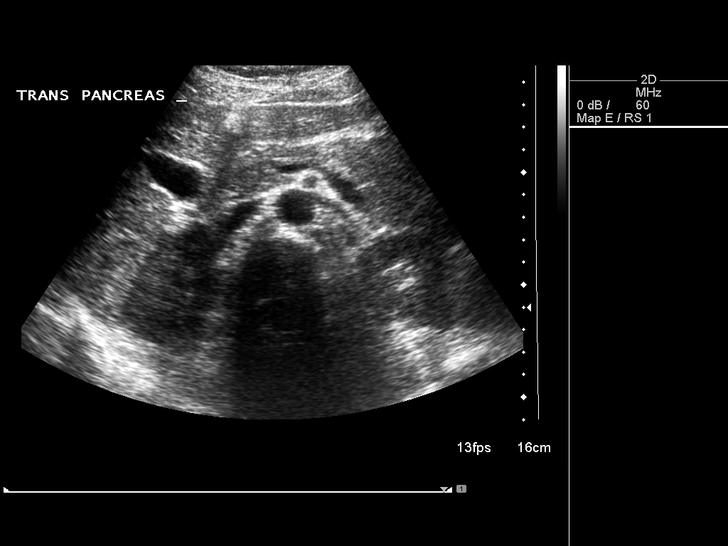
[im 7/77]
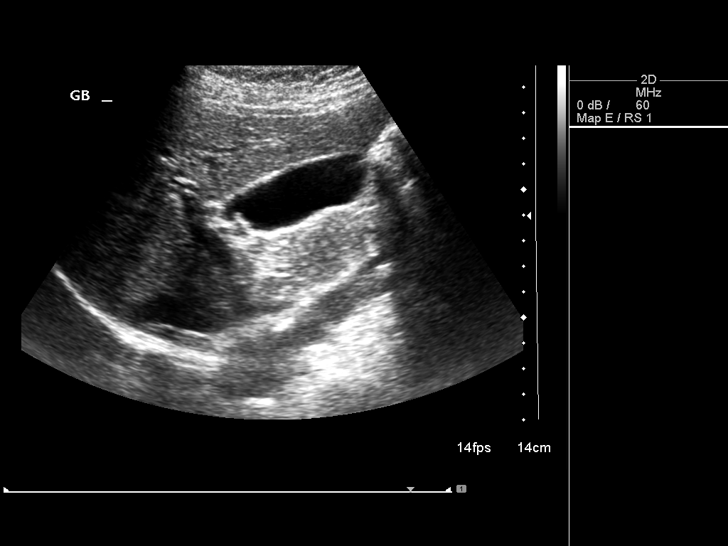
[im 13/77]
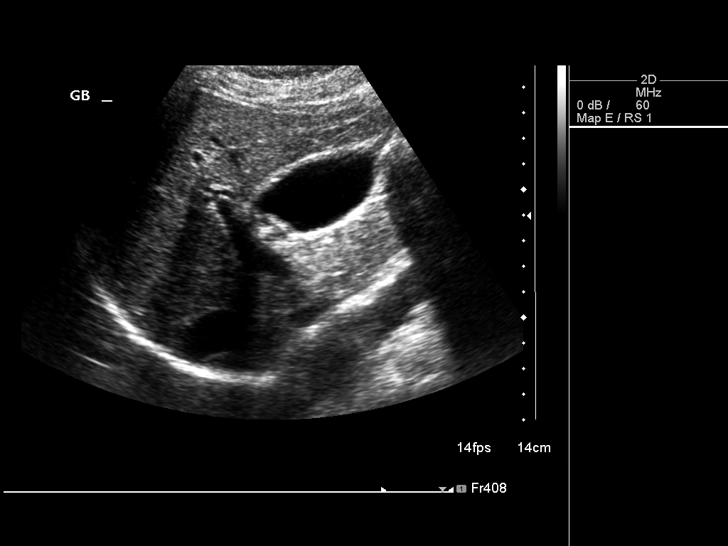
[im 20/77]
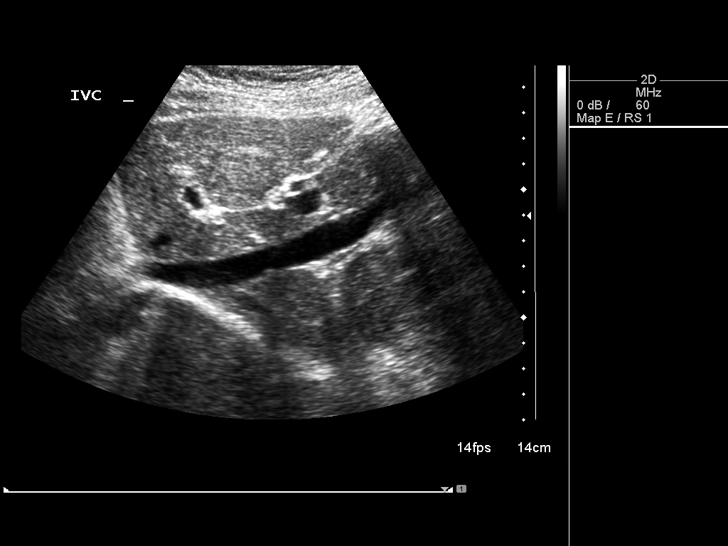
[im 26/77]
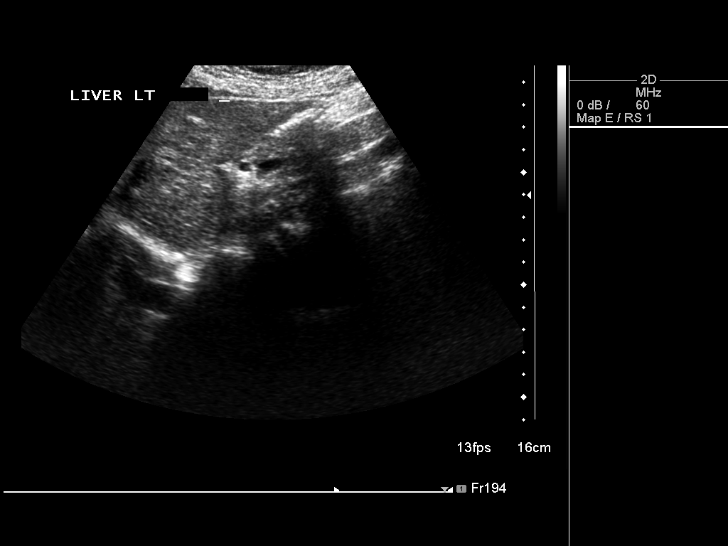
[im 29/77]
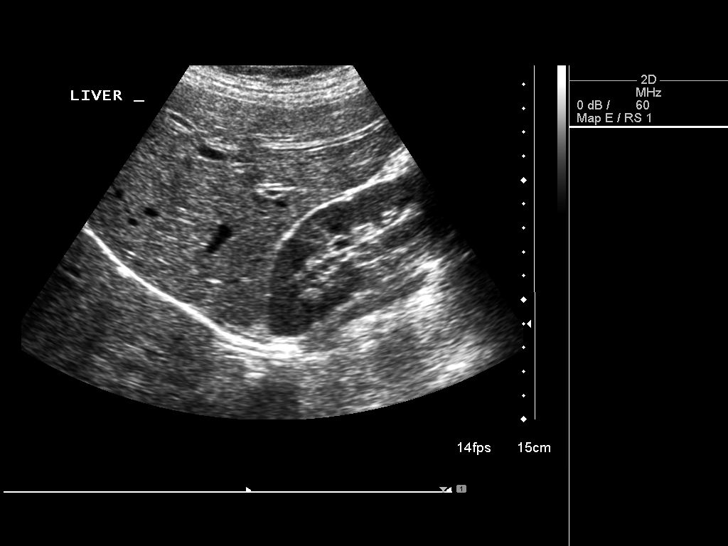
[im 35/77]
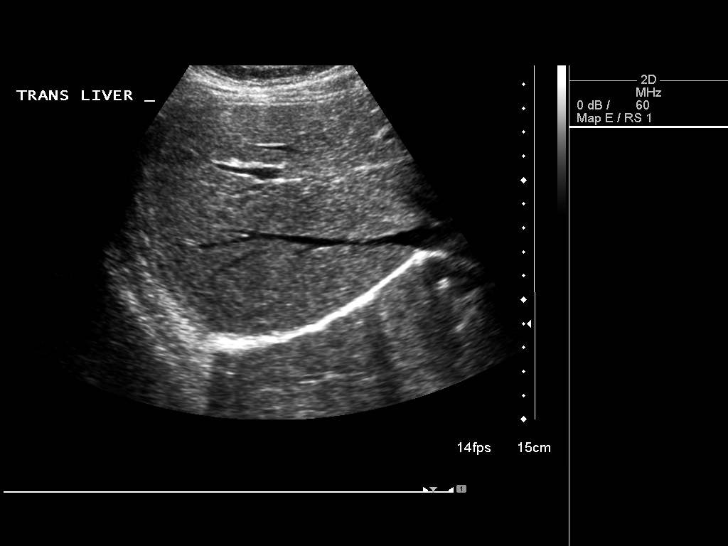
[im 42/77]
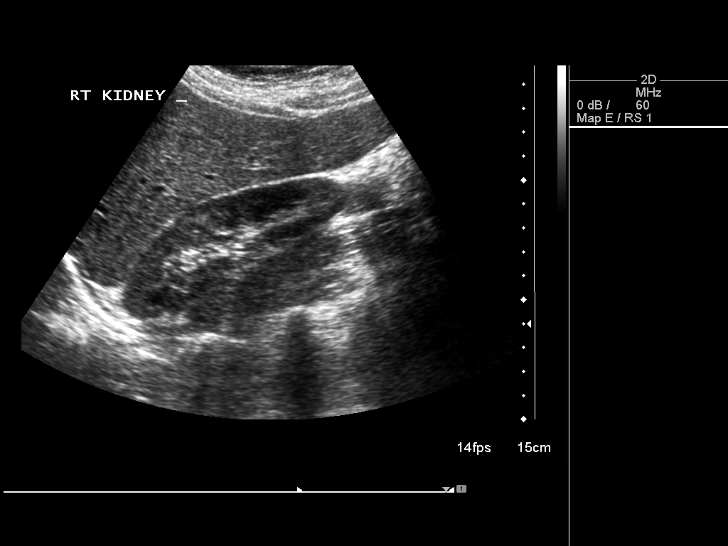
[im 48/77]
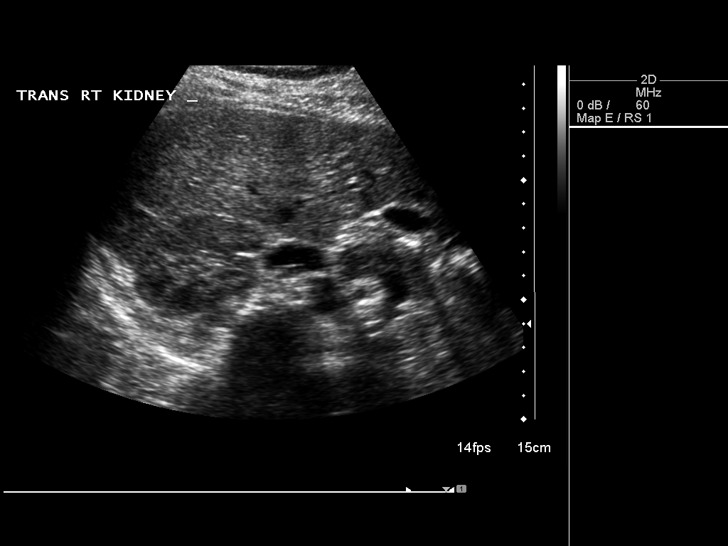
[im 51/77]
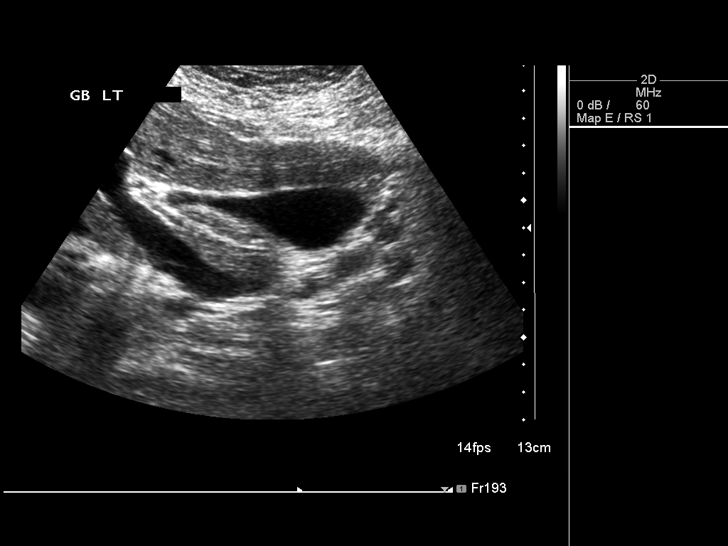
[im 58/77]
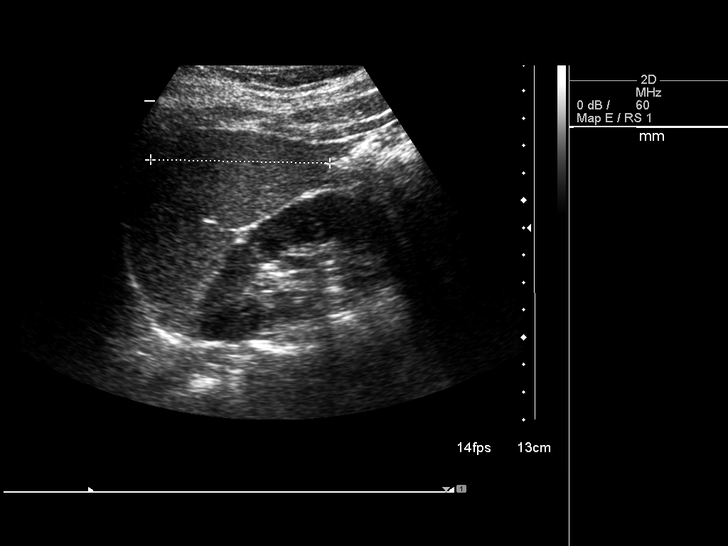
[im 64/77]
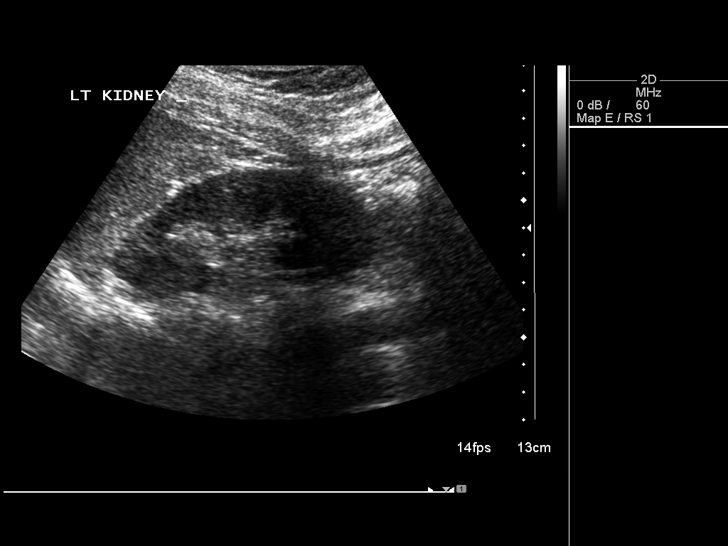
[im 70/77]
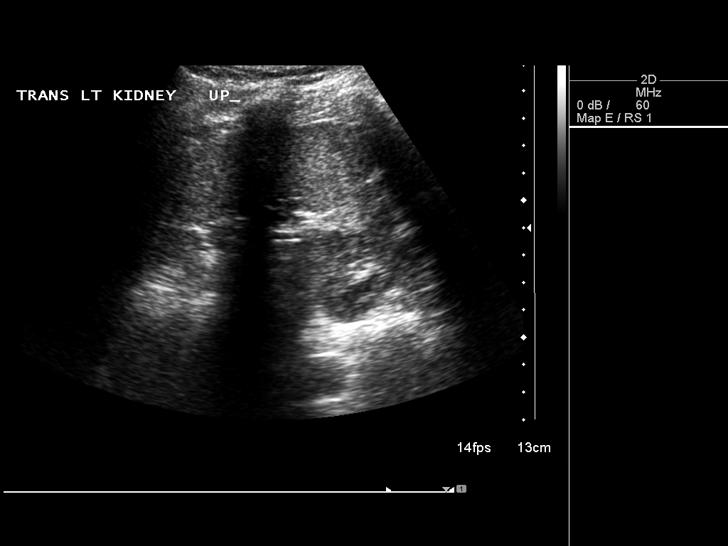
[im 77/77]
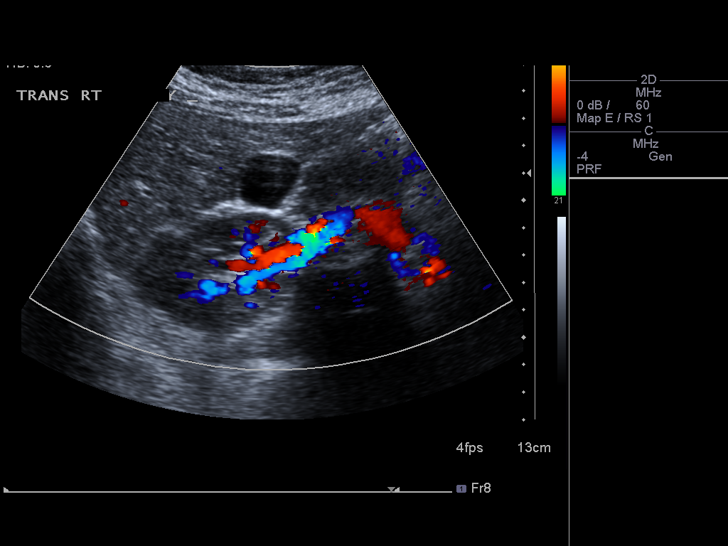

[14 of 25 positions shown; findings below may reference images not displayed]

FINDINGS: Gallbladder: No gallstones or wall thickening visualized. No
sonographic Murphy sign noted by sonographer.

Common bile duct: Diameter: 3.6 mm

Liver: No focal lesion identified. Within normal limits in
parenchymal echogenicity.

IVC: No abnormality visualized.

Pancreas: Visualized portion unremarkable.

Spleen: Size and appearance within normal limits.

Right Kidney: Length: 10.8 cm. Echogenicity within normal limits. No
mass or hydronephrosis visualized.

Left Kidney: Length: 10.1 cm. Echogenicity within normal limits. No
mass or hydronephrosis visualized.

Abdominal aorta: No aneurysm visualized.

Other findings: None.
IMPRESSION: No acute or focal abnormality identified.

## 2019-04-20 ENCOUNTER — Ambulatory Visit: Payer: BC Managed Care – PPO | Attending: Internal Medicine

## 2019-04-20 DIAGNOSIS — Z23 Encounter for immunization: Secondary | ICD-10-CM | POA: Insufficient documentation

## 2019-04-20 NOTE — Progress Notes (Signed)
   Covid-19 Vaccination Clinic  Name:  Lorraine Garrett    MRN: [id number] DOB: 12/26/1985  04/20/2019  [name] was observed post Covid-19 immunization for 15 minutes without incident. She was provided with Vaccine Information Sheet and instruction to access the V-Safe system.   [name] was instructed to call 911 with any severe reactions post vaccine: Marland Kitchen Difficulty breathing  . Swelling of face and throat  . A fast heartbeat  . A bad rash all over body  . Dizziness and weakness   Immunizations Administered    Name Date Dose VIS Date Route   Pfizer COVID-19 Vaccine 04/20/2019  8:37 AM 0.3 mL 01/25/2019 Intramuscular   Manufacturer: [employer], [employer]   Lot: [id number]   NDC: [address]-2

## 2019-05-11 ENCOUNTER — Ambulatory Visit: Payer: BC Managed Care – PPO | Attending: Internal Medicine

## 2019-05-11 DIAGNOSIS — Z23 Encounter for immunization: Secondary | ICD-10-CM

## 2019-05-11 NOTE — Progress Notes (Signed)
   Covid-19 Vaccination Clinic  Name:  Lorraine Garrett    MRN: [id number] DOB: 1985/07/02  05/11/2019  [name] was observed post Covid-19 immunization for 15 minutes without incident. She was provided with Vaccine Information Sheet and instruction to access the V-Safe system.   [name] was instructed to call 911 with any severe reactions post vaccine: Marland Kitchen Difficulty breathing  . Swelling of face and throat  . A fast heartbeat  . A bad rash all over body  . Dizziness and weakness   Immunizations Administered    Name Date Dose VIS Date Route   Pfizer COVID-19 Vaccine 05/11/2019  9:50 AM 0.3 mL 01/25/2019 Intramuscular   Manufacturer: [employer], [employer]   Lot: [id number]   NDC: [address]-7

## 2022-06-05 ENCOUNTER — Telehealth: Payer: BC Managed Care – PPO | Admitting: Physician Assistant

## 2022-06-05 DIAGNOSIS — B9689 Other specified bacterial agents as the cause of diseases classified elsewhere: Secondary | ICD-10-CM

## 2022-06-05 DIAGNOSIS — J329 Chronic sinusitis, unspecified: Secondary | ICD-10-CM

## 2022-06-05 MED ORDER — AMOXICILLIN-POT CLAVULANATE 875-125 MG PO TABS: 1.0000 | ORAL_TABLET | Freq: Two times a day (BID) | ORAL | 0 refills | Status: AC

## 2022-06-05 NOTE — Patient Instructions (Signed)
  Kaylyn Lim, thank you for joining [name], PA-C for today's virtual visit.  While this provider is not your primary care provider (PCP), if your PCP is located in our provider database this encounter information will be shared with them immediately following your visit.   A [facility] MyChart account gives you access to today's visit and all your visits, tests, and labs performed at [facility] " click here if you don't have a [facility] MyChart account or go to [email or web address]  Consent: (Patient) Lorraine Garrett provided verbal consent for this virtual visit at the beginning of the encounter.  Current Medications:  Current Outpatient Medications:    amoxicillin-clavulanate (AUGMENTIN) 875-125 MG tablet, Take 1 tablet by mouth 2 (two) times daily., Disp: 20 tablet, Rfl: 0   acetaminophen (TYLENOL) 325 MG tablet, Take 650 mg by mouth every 6 (six) hours as needed for headache., Disp: , Rfl:    ibuprofen (ADVIL,MOTRIN) 600 MG tablet, Take 1 tablet (600 mg total) every 6 (six) hours as needed by mouth., Disp: 45 tablet, Rfl: 1   Prenatal Vit-Fe Fumarate-FA (PRENATAL MULTIVITAMIN) TABS tablet, Take 1 tablet daily at 12 noon by mouth., Disp: 100 tablet, Rfl: 3   Medications ordered in this encounter:  Meds ordered this encounter  Medications   amoxicillin-clavulanate (AUGMENTIN) 875-125 MG tablet    Sig: Take 1 tablet by mouth 2 (two) times daily.    Dispense:  20 tablet    Refill:  0    Order Specific Question:   Supervising Provider    Answer:   Merrilee Jansky [id number]     *If you need refills on other medications prior to your next appointment, please contact your pharmacy*  Follow-Up: Call back or seek an in-person evaluation if the symptoms worsen or if the condition fails to improve as anticipated.  [facility] [phone]  Other Instructions Take antibiotic as prescribed.  Recommend mucinex and flonase.  Can take ibuprofen as  needed for headache, pain.  Drink plenty of fluids. Follow up for in person evaluation with PCP or Urgent Care if symptoms become worse or no improvement.    If you have been instructed to have an in-person evaluation today at a local Urgent Care facility, please use the link below. It will take you to a list of all of our available [facility], including address, phone number and hours of operation. Please do not delay care.  [facility]  If you or a family member do not have a primary care provider, use the link below to schedule a visit and establish care. When you choose a [facility] primary care physician or advanced practice provider, you gain a long-term partner in health. Find a Primary Care Provider  Learn more about [facility]'s in-office and virtual care options: [facility] Get [facility] Now

## 2022-06-05 NOTE — Progress Notes (Signed)
Virtual Visit Consent   Lorraine Garrett, you are scheduled for a virtual visit with a [facility] provider today. Just as with appointments in the office, your consent must be obtained to participate. Your consent will be active for this visit and any virtual visit you may have with one of our providers in the next 365 days. If you have a MyChart account, a copy of this consent can be sent to you electronically.  As this is a virtual visit, video technology does not allow for your provider to perform a traditional examination. This may limit your provider's ability to fully assess your condition. If your provider identifies any concerns that need to be evaluated in person or the need to arrange testing (such as labs, EKG, etc.), we will make arrangements to do so. Although advances in technology are sophisticated, we cannot ensure that it will always work on either your end or our end. If the connection with a video visit is poor, the visit may have to be switched to a telephone visit. With either a video or telephone visit, we are not always able to ensure that we have a secure connection.  By engaging in this virtual visit, you consent to the provision of healthcare and authorize for your insurance to be billed (if applicable) for the services provided during this visit. Depending on your insurance coverage, you may receive a charge related to this service.  I need to obtain your verbal consent now. Are you willing to proceed with your visit today? Lorraine Garrett has provided verbal consent on 06/05/2022 for a virtual visit (video or telephone). Lorraine Fantasia Ward, PA-C  Date: 06/05/2022 10:50 AM  Virtual Visit via Video Note   I, Lorraine Garrett, connected with  Lorraine Garrett  ([id number], August 25, 1985) on 06/05/22 at 10:45 AM EDT by a video-enabled telemedicine application and verified that I am speaking with the correct person using two identifiers.  Location: Patient: Virtual Visit Location Patient:  Home Provider: Virtual Visit Location Provider: Home   I discussed the limitations of evaluation and management by telemedicine and the availability of in person appointments. The patient expressed understanding and agreed to proceed.    History of Present Illness: Lorraine Garrett is a 37 y.o. who identifies as a female who was assigned female at birth, and is being seen today for sinus pain and pressure, postnasal drip, congestion that started about a week and a half ago.  She has taken a home covid test which was negative.  She denies fever, cough.  She is taking otc sinus medication with relief.  HPI: HPI  Problems:  Patient Active Problem List   Diagnosis Date Noted   Cholestasis during pregnancy in third trimester 12/27/2016   S/P dilatation and curettage 04/04/2014   Missed abortion 04/03/2014    Allergies:  Allergies  Allergen Reactions   Aspirin Other (See Comments)    Asthma attack   Medications:  Current Outpatient Medications:    amoxicillin-clavulanate (AUGMENTIN) 875-125 MG tablet, Take 1 tablet by mouth 2 (two) times daily., Disp: 20 tablet, Rfl: 0   acetaminophen (TYLENOL) 325 MG tablet, Take 650 mg by mouth every 6 (six) hours as needed for headache., Disp: , Rfl:    ibuprofen (ADVIL,MOTRIN) 600 MG tablet, Take 1 tablet (600 mg total) every 6 (six) hours as needed by mouth., Disp: 45 tablet, Rfl: 1   Prenatal Vit-Fe Fumarate-FA (PRENATAL MULTIVITAMIN) TABS tablet, Take 1 tablet daily at 12 noon by mouth., Disp: 100 tablet, Rfl:  3  Observations/Objective: Patient is well-developed, well-nourished in no acute distress.  Resting comfortably at home.  Head is normocephalic, atraumatic.  No labored breathing.  Speech is clear and coherent with logical content.  Patient is alert and oriented at baseline.    Assessment and Plan: 1. Sinusitis, bacterial - amoxicillin-clavulanate (AUGMENTIN) 875-125 MG tablet; Take 1 tablet by mouth 2 (two) times daily.  Dispense: 20  tablet; Refill: 0  Supportive care discussed.   Follow Up Instructions: I discussed the assessment and treatment plan with the patient. The patient was provided an opportunity to ask questions and all were answered. The patient agreed with the plan and demonstrated an understanding of the instructions.  A copy of instructions were sent to the patient via MyChart unless otherwise noted below.     The patient was advised to call back or seek an in-person evaluation if the symptoms worsen or if the condition fails to improve as anticipated.  Time:  I spent 5 minutes with the patient via telehealth technology discussing the above problems/concerns.    Lorraine Fantasia Ward, PA-C

## 2023-09-11 ENCOUNTER — Other Ambulatory Visit: Payer: Self-pay | Admitting: Obstetrics and Gynecology

## 2023-09-11 DIAGNOSIS — R928 Other abnormal and inconclusive findings on diagnostic imaging of breast: Secondary | ICD-10-CM

## 2023-09-13 ENCOUNTER — Ambulatory Visit
Admission: RE | Admit: 2023-09-13 | Discharge: 2023-09-13 | Disposition: A | Discharge status: Home / Self Care | Payer: Self-pay | Source: Ambulatory Visit | Attending: Obstetrics and Gynecology | Admitting: Obstetrics and Gynecology

## 2023-09-13 ENCOUNTER — Inpatient Hospital Stay
Admission: RE | Admit: 2023-09-13 | Discharge: 2023-09-13 | Discharge status: Home / Self Care | Payer: Self-pay | Source: Ambulatory Visit | Attending: Obstetrics and Gynecology | Admitting: Obstetrics and Gynecology

## 2023-09-13 DIAGNOSIS — R928 Other abnormal and inconclusive findings on diagnostic imaging of breast: Secondary | ICD-10-CM
